# Patient Record
Sex: Female | Born: 1975 | Race: White | Hispanic: No | Marital: Single
Health system: Southern US, Community
[De-identification: ages and names within clinical notes are randomized; demographics above are authoritative.]

## PROBLEM LIST (undated history)

## (undated) DIAGNOSIS — R569 Unspecified convulsions: Secondary | ICD-10-CM

## (undated) DIAGNOSIS — G35 Multiple sclerosis: Secondary | ICD-10-CM

---

## 2004-09-20 ENCOUNTER — Inpatient Hospital Stay (HOSPITAL_COMMUNITY): Admission: AD | Admit: 2004-09-20 | Discharge: 2004-09-20 | Payer: Self-pay | Admitting: Obstetrics and Gynecology

## 2004-09-20 ENCOUNTER — Ambulatory Visit: Payer: Self-pay | Admitting: Family Medicine

## 2004-09-20 IMAGING — US US TRANSVAGINAL NON-OB
1 series · 14 of 25 positions shown · non-contrast
Comparison: none

CLINICAL DATA: Heavy vaginal bleeding for 4 days.  LMP [DATE].
PELVIC ULTRASOUND WITH TRANSVAGINAL, [DATE]
Multiple images of the uterus and adnexa were obtained using transabdominal and endovaginal approach. 
The uterus has a maximal sagittal length of 7.6 cm and an AP width of 3.9 cm.  A homogeneous uterine myometrium is seen.  The endometrial stripe appears thin with an AP width measuring 3.7 mm.  No focal thickening or inhomogeneity is noted.
Both ovaries are seen and have a normal appearance, with the right ovary measuring 3.2 x 2.6 x 2.6   cm and the left ovary measuring 3.6 x 2.5 x 2.6 cm.  Both ovaries contain several small follicles.  No cul-de-sac or periovarian fluid is seen and no separate adnexal masses are noted.
IMPRESSION
Normal pelvic ultrasound.

[Series 1: us transvaginal non-ob · 0.29mm/px · 14 of 50 slices shown]
[im 1/50]
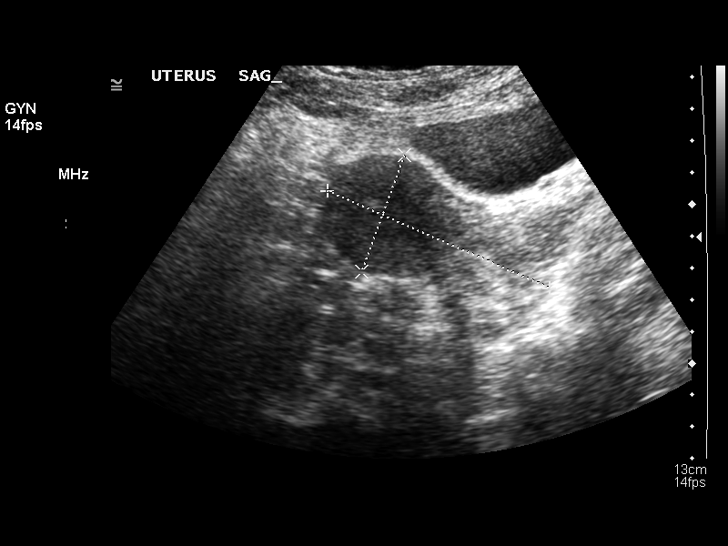
[im 5/50]
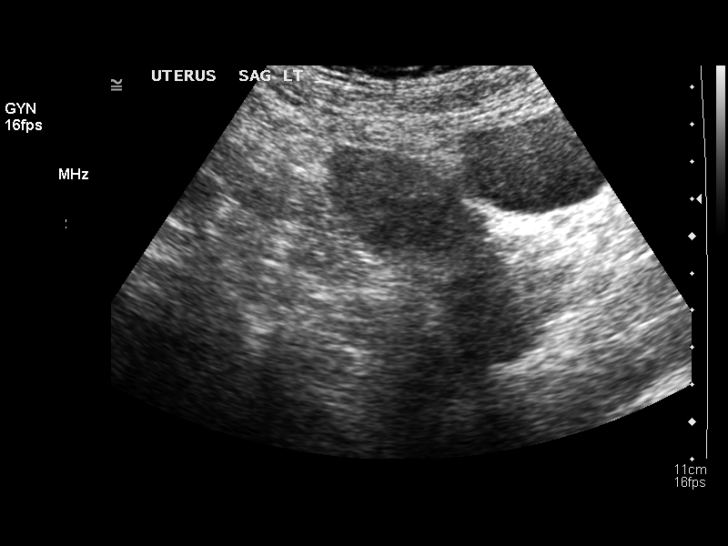
[im 9/50]
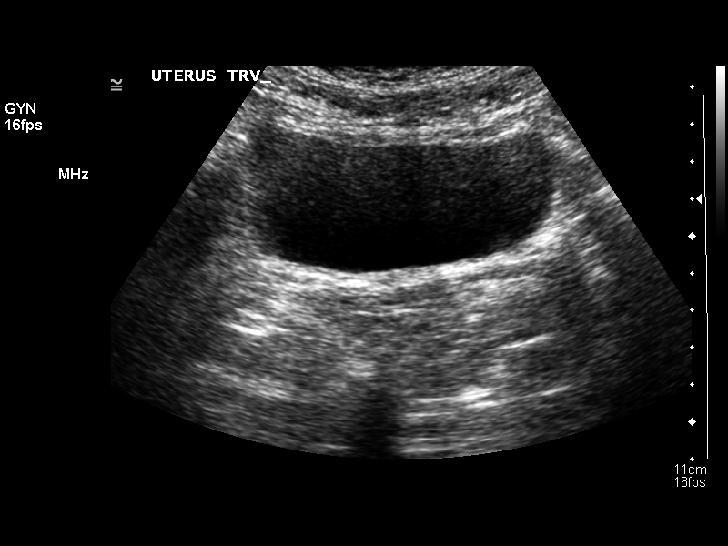
[im 13/50]
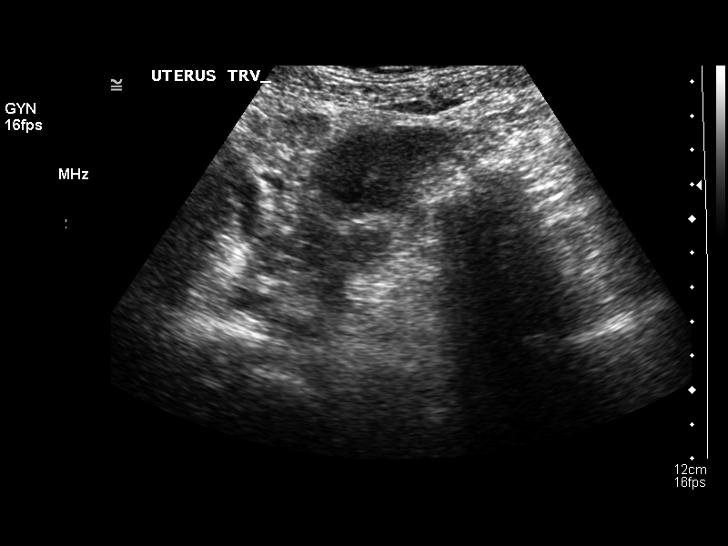
[im 17/50]
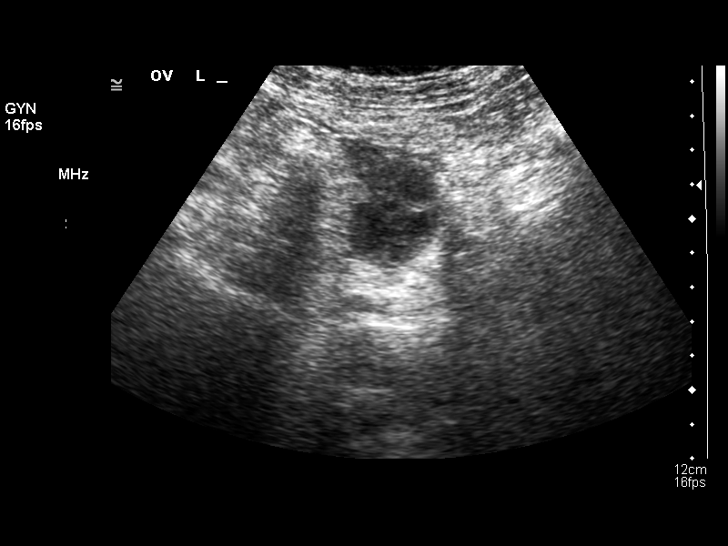
[im 19/50]
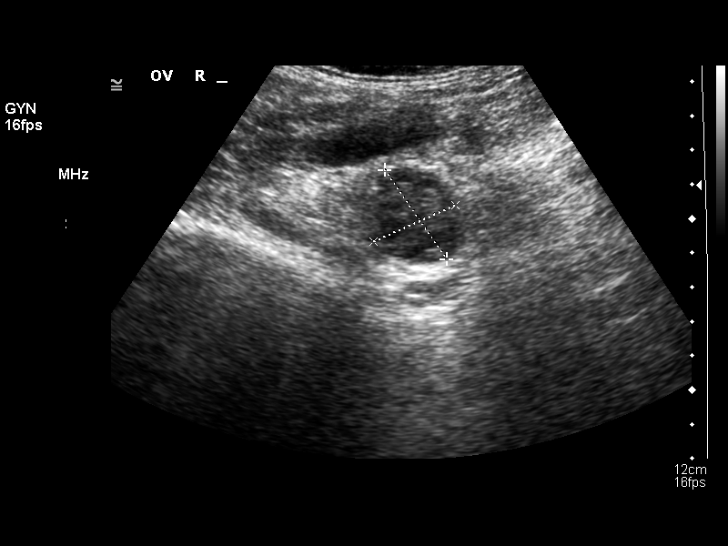
[im 23/50]
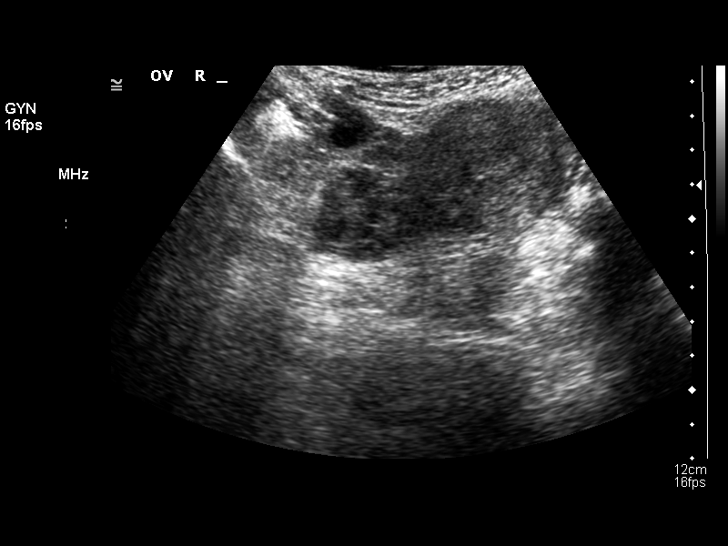
[im 27/50]
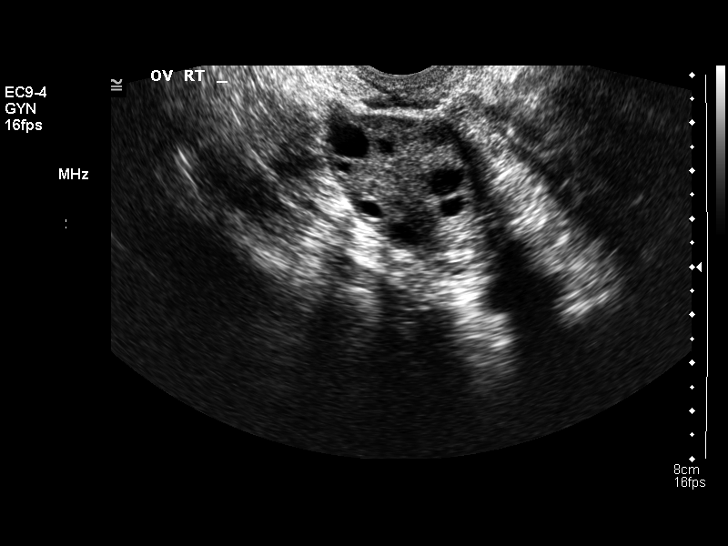
[im 31/50]
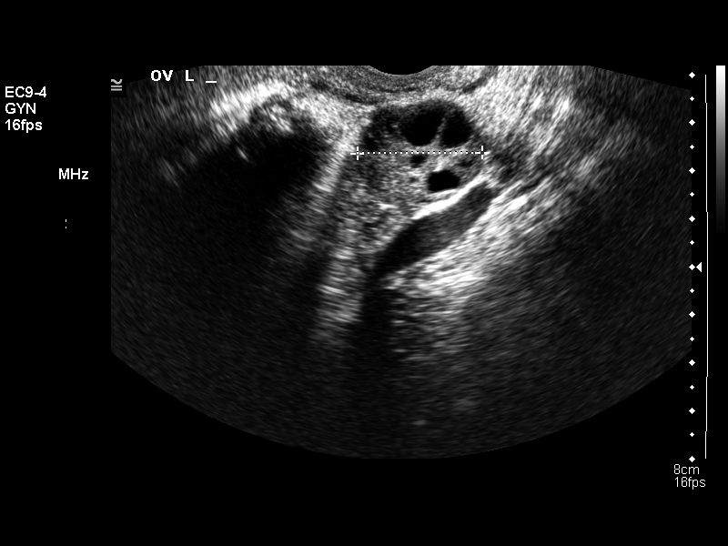
[im 33/50]
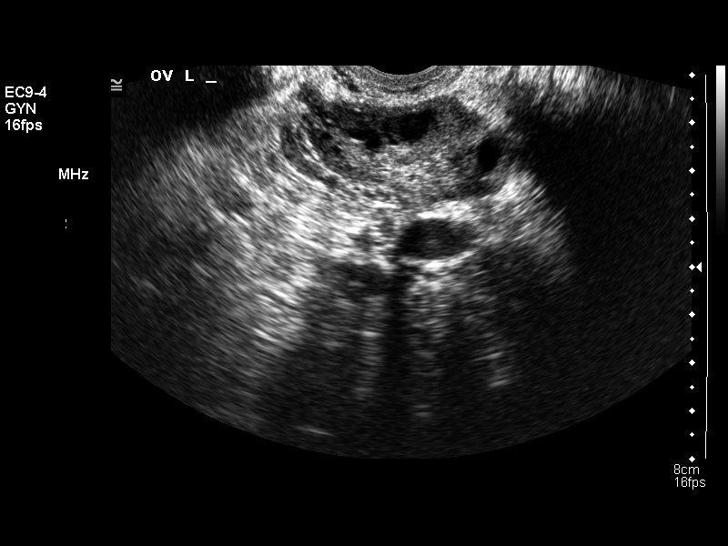
[im 37/50]
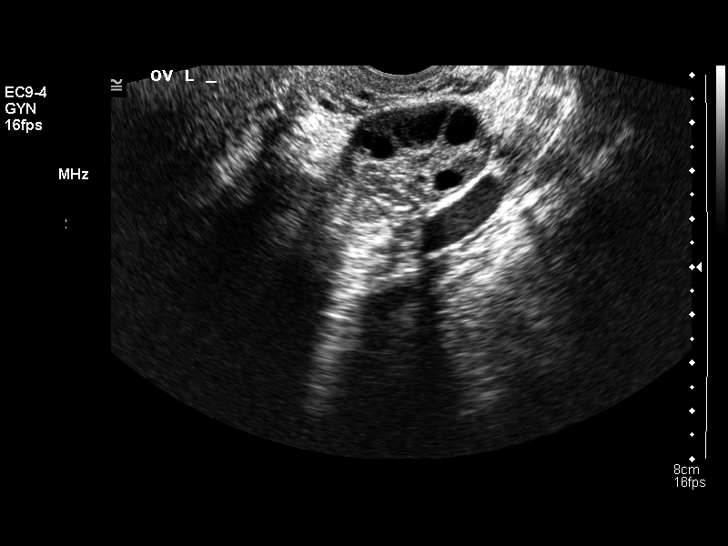
[im 41/50]
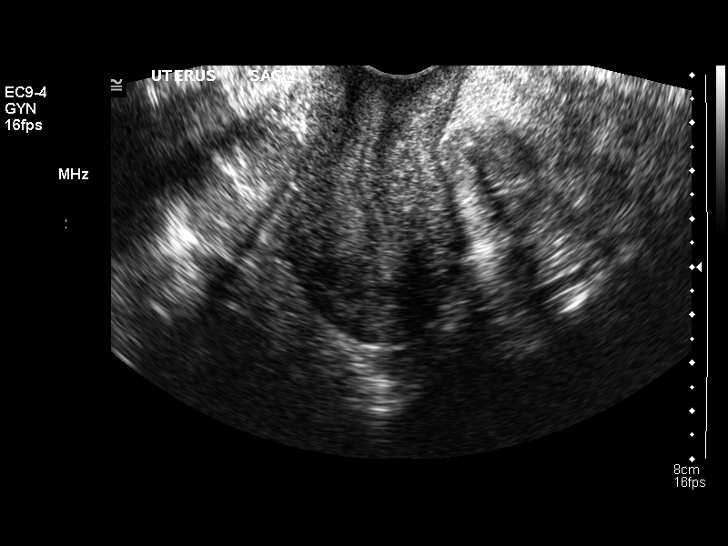
[im 45/50]
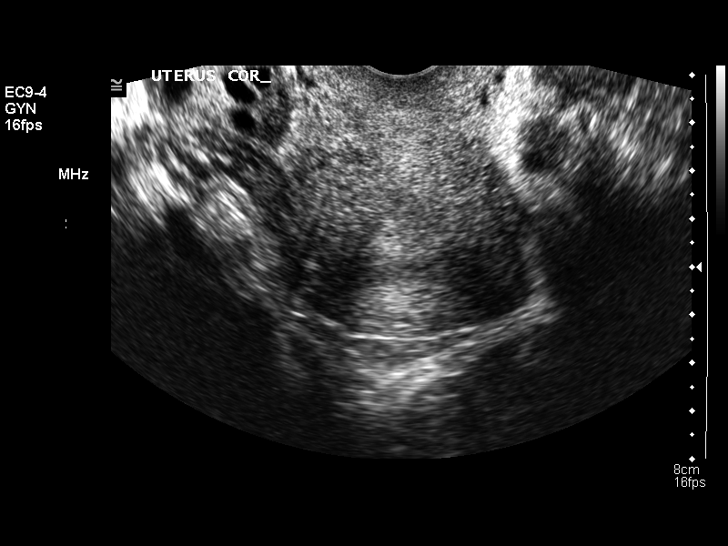
[im 50/50]
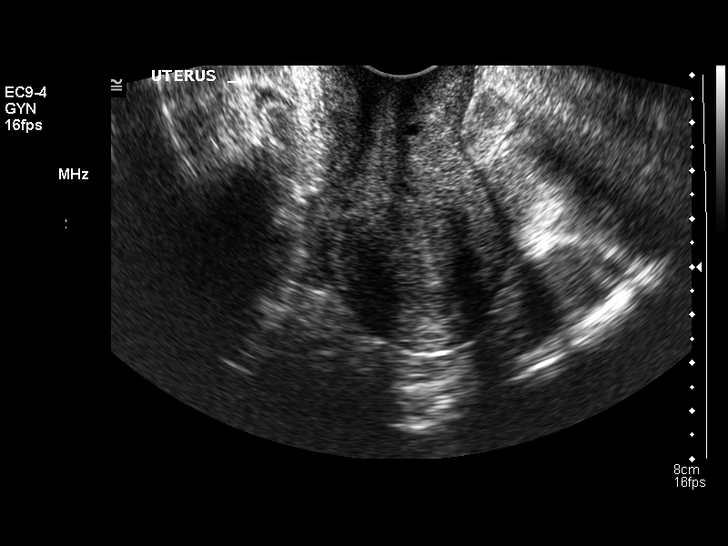

[14 of 25 positions shown; findings below may reference images not displayed]

## 2004-09-27 ENCOUNTER — Inpatient Hospital Stay (HOSPITAL_COMMUNITY): Admission: AD | Admit: 2004-09-27 | Discharge: 2004-09-27 | Payer: Self-pay | Admitting: Obstetrics and Gynecology

## 2021-04-21 ENCOUNTER — Emergency Department (HOSPITAL_COMMUNITY)
Admission: EM | Admit: 2021-04-21 | Discharge: 2021-04-22 | Disposition: A | Discharge status: Home / Self Care | Payer: 59 | Attending: Emergency Medicine | Admitting: Emergency Medicine

## 2021-04-21 ENCOUNTER — Encounter (HOSPITAL_COMMUNITY): Payer: Self-pay | Admitting: Student

## 2021-04-21 DIAGNOSIS — R5383 Other fatigue: Secondary | ICD-10-CM

## 2021-04-21 DIAGNOSIS — R531 Weakness: Secondary | ICD-10-CM | POA: Diagnosis present

## 2021-04-21 DIAGNOSIS — M7918 Myalgia, other site: Secondary | ICD-10-CM | POA: Diagnosis not present

## 2021-04-21 DIAGNOSIS — G35 Multiple sclerosis: Secondary | ICD-10-CM | POA: Insufficient documentation

## 2021-04-21 HISTORY — DX: Unspecified convulsions: R56.9

## 2021-04-21 HISTORY — DX: Multiple sclerosis: G35

## 2021-04-21 LAB — CBC WITH DIFFERENTIAL/PLATELET
Abs Immature Granulocytes: 0.02 10*3/uL (ref 0.00–0.07)
Basophils Absolute: 0 10*3/uL (ref 0.0–0.1)
Basophils Relative: 0 %
Eosinophils Absolute: 0.1 10*3/uL (ref 0.0–0.5)
Eosinophils Relative: 1 %
HCT: 41.9 % (ref 36.0–46.0)
Hemoglobin: 13.6 g/dL (ref 12.0–15.0)
Immature Granulocytes: 0 %
Lymphocytes Relative: 4 %
Lymphs Abs: 0.3 10*3/uL — ABNORMAL LOW (ref 0.7–4.0)
MCH: 30.6 pg (ref 26.0–34.0)
MCHC: 32.5 g/dL (ref 30.0–36.0)
MCV: 94.4 fL (ref 80.0–100.0)
Monocytes Absolute: 0.5 10*3/uL (ref 0.1–1.0)
Monocytes Relative: 7 %
Neutro Abs: 5.5 10*3/uL (ref 1.7–7.7)
Neutrophils Relative %: 88 %
Platelets: 296 10*3/uL (ref 150–400)
RBC: 4.44 MIL/uL (ref 3.87–5.11)
RDW: 12.9 % (ref 11.5–15.5)
WBC: 6.3 10*3/uL (ref 4.0–10.5)
nRBC: 0 % (ref 0.0–0.2)

## 2021-04-21 LAB — I-STAT BETA HCG BLOOD, ED (MC, WL, AP ONLY): I-stat hCG, quantitative: <5 m[IU]/mL (ref <5)

## 2021-04-21 NOTE — ED Triage Notes (Signed)
Pt reports that she has MS and has had multiple falls over the past few weeks, pt reports that she is having chronic dizziness, and weakness and wants everything checked out

## 2021-04-21 NOTE — ED Provider Notes (Signed)
  Emergency Medicine Provider in Triage Note   MSE was initiated and I personally evaluated the patient  10:30 PM on Apr 21, 2021 as provider in triage.   Chief Complaint: Fatigue  HPI  Patient is a 45 y.o. who presets to the ED with complaints of fatigue and just not feeling right since her last MS relapse a few weeks ago. She has trouble further describing this. States she has intermittent numbness to her whole body which is unchanged - has this at baseline.     Review of Systems  Positive: Fatigue, not feel quite right, numbness Negative: Chest pain, dyspnea, fever  Physical Exam  BP 117/80 (BP Location: Left Arm)   Pulse 88   Temp 98 F (36.7 C) (Oral)   Resp 17   SpO2 99%    Gen:   Awake, no distress   HEENT:  Atraumatic  Resp:  Normal effort  Cardiac:  Normal rate  Abd:   Nondistended, nontender  MSK:   Moves extremities without difficulty  Neuro:  Speech clear, sensation intact to light touch x 4, grip strength & plantar/dorsiflexion strength intact bilaterally.   Medical Decision Making   Initiation of care has begun. The patient has been counseled on the process, plan, and necessity for staying for the completion/evaluation, informed that the remainder of the evaluation will be completed by another provider, this initial triage assessment does not replace that evaluation, and the importance of remaining in the ED until their evaluation is complete.  Will obtain basic labs at this time.   Clinical Impression  Fatigue.        Desmond Lope 04/21/21 2237    Tegeler, Canary Brim, MD 04/22/21 [phone]

## 2021-04-22 ENCOUNTER — Other Ambulatory Visit: Payer: Self-pay

## 2021-04-22 ENCOUNTER — Emergency Department (HOSPITAL_COMMUNITY)
Admission: EM | Admit: 2021-04-22 | Discharge: 2021-04-23 | Disposition: A | Discharge status: Home / Self Care | Payer: 59 | Source: Home / Self Care | Attending: Emergency Medicine | Admitting: Emergency Medicine

## 2021-04-22 DIAGNOSIS — M791 Myalgia, unspecified site: Secondary | ICD-10-CM

## 2021-04-22 DIAGNOSIS — G35 Multiple sclerosis: Secondary | ICD-10-CM | POA: Diagnosis not present

## 2021-04-22 DIAGNOSIS — Z5321 Procedure and treatment not carried out due to patient leaving prior to being seen by health care provider: Secondary | ICD-10-CM | POA: Insufficient documentation

## 2021-04-22 DIAGNOSIS — M7918 Myalgia, other site: Secondary | ICD-10-CM | POA: Insufficient documentation

## 2021-04-22 LAB — COMPREHENSIVE METABOLIC PANEL
ALT: 23 U/L (ref 0–44)
AST: 17 U/L (ref 15–41)
Albumin: 4.3 g/dL (ref 3.5–5.0)
Alkaline Phosphatase: 55 U/L (ref 38–126)
Anion gap: 8 (ref 5–15)
BUN: 8 mg/dL (ref 6–20)
CO2: 25 mmol/L (ref 22–32)
Calcium: 9.3 mg/dL (ref 8.9–10.3)
Chloride: 103 mmol/L (ref 98–111)
Creatinine, Ser: 0.7 mg/dL (ref 0.44–1.00)
GFR, Estimated: >60 mL/min (ref >60)
Glucose, Bld: 104 mg/dL — ABNORMAL HIGH (ref 70–99)
Potassium: 3.7 mmol/L (ref 3.5–5.1)
Sodium: 136 mmol/L (ref 135–145)
Total Bilirubin: 1.2 mg/dL (ref 0.3–1.2)
Total Protein: 6.7 g/dL (ref 6.5–8.1)

## 2021-04-22 LAB — URINALYSIS, ROUTINE W REFLEX MICROSCOPIC
Bilirubin Urine: NEGATIVE
Glucose, UA: NEGATIVE mg/dL
Hgb urine dipstick: NEGATIVE
Ketones, ur: 20 mg/dL — AB
Nitrite: NEGATIVE
Protein, ur: NEGATIVE mg/dL
Specific Gravity, Urine: 1.025 (ref 1.005–1.030)
pH: 6 (ref 5.0–8.0)

## 2021-04-22 MED ORDER — GABAPENTIN 100 MG PO CAPS
100.0000 mg | ORAL_CAPSULE | Freq: Once | ORAL | Status: AC
  Administered 2021-04-22: 100 mg via ORAL
  Filled 2021-04-22: qty 1

## 2021-04-22 NOTE — ED Notes (Signed)
The pt immediately went back to sleep

## 2021-04-22 NOTE — ED Provider Notes (Signed)
[facility] DEPT [facility] Emergency Department Provider Note MRN:  [id number]  Arrival date & time: 04/22/21     Chief Complaint   Multiple Sclerosis   History of Present Illness   Sonia Ayala is a 45 y.o. year-old female with a history of MS presenting to the ED with chief complaint of MS.  Patient explains that she was recently in the ICU for her MS.  She had a recent flare and she is upset because she has not recovered very well.  She denies any new or worsening symptoms over the past few days or weeks.  She is having trouble coping with her symptoms.  She endorses total body weakness, migratory sensory abnormalities.  Denies any change to her speech, no vision change, no chest pain or shortness of breath, no abdominal pain.  Symptoms constant, no exacerbating or alleviating factors.  Review of Systems  A complete 10 system review of systems was obtained and all systems are negative except as noted in the HPI and PMH.   Patient's Health History    Past Medical History:  Diagnosis Date  . Multiple sclerosis (HCC)   . Seizures (HCC)     History reviewed. No pertinent surgical history.  History reviewed. No pertinent family history.  Social History   Socioeconomic History  . Marital status: Single    Spouse name: Not on file  . Number of children: Not on file  . Years of education: Not on file  . Highest education level: Not on file  Occupational History  . Not on file  Tobacco Use  . Smoking status: Not on file  . Smokeless tobacco: Not on file  Substance and Sexual Activity  . Alcohol use: Not on file  . Drug use: Not on file  . Sexual activity: Not on file  Other Topics Concern  . Not on file  Social History Narrative  . Not on file   Social Determinants of Health   Financial Resource Strain: Not on file  Food Insecurity: Not on file  Transportation Needs: Not on file  Physical Activity: Not on file  Stress: Not on file  Social Connections:  Not on file  Intimate Partner Violence: Not on file     Physical Exam   Vitals:   04/21/21 2214  BP: 117/80  Pulse: 88  Resp: 17  Temp: 98 F (36.7 C)  SpO2: 99%    CONSTITUTIONAL: Well-appearing, NAD NEURO:  Alert and oriented x 3, no focal deficits, slow gait EYES:  eyes equal and reactive ENT/NECK:  no LAD, no JVD CARDIO: Regular rate, well-perfused, normal S1 and S2 PULM:  CTAB no wheezing or rhonchi GI/GU:  normal bowel sounds, non-distended, non-tender MSK/SPINE:  No gross deformities, no edema SKIN:  no rash, atraumatic PSYCH:  Appropriate speech and behavior  *Additional and/or pertinent findings included in MDM below  Diagnostic and Interventional Summary    EKG Interpretation  Date/Time:    Ventricular Rate:    PR Interval:    QRS Duration:   QT Interval:    QTC Calculation:   R Axis:     Text Interpretation:        Labs Reviewed  COMPREHENSIVE METABOLIC PANEL - Abnormal; Notable for the following components:      Result Value   Glucose, Bld 104 (*)    All other components within normal limits  CBC WITH DIFFERENTIAL/PLATELET - Abnormal; Notable for the following components:   Lymphs Abs 0.3 (*)    All  other components within normal limits  URINALYSIS, ROUTINE W REFLEX MICROSCOPIC - Abnormal; Notable for the following components:   Color, Urine AMBER (*)    APPearance CLOUDY (*)    Ketones, ur 20 (*)    Leukocytes,Ua SMALL (*)    Bacteria, UA FEW (*)    All other components within normal limits  I-STAT BETA HCG BLOOD, ED ([facility], [facility], [facility] ONLY)    No orders to display    Medications  gabapentin (NEURONTIN) capsule 100 mg (100 mg Oral Given 04/22/21 0401)     Procedures  /  Critical Care Procedures  ED Course and Medical Decision Making  I have reviewed the triage vital signs, the nursing notes, and pertinent available records from the EMR.  Listed above are laboratory and imaging tests that I personally ordered, reviewed, and interpreted and  then considered in my medical decision making (see below for details).  This is a difficult patient encounter given the lack of EMR information.  She endorses a history of MS, but we have no evidence of that in our records.  She explains that she is recently moved from [place].  When asked about where she spent time in the ICU for her MS, she cannot remember.  She has trouble even remembering what state the hospital was in.  I have sat down and spoken with her multiple times about her symptoms, even she explains that she does not think she is currently having a flare, she is just having trouble coping with her symptoms.  Lack of support emotionally.  Denies SI or HI or AVH.  On my third encounter with the patient, she begins to ask for pain medicine.  Initially she did not mention any pain.  And now I am starting to suspect some type of secondary gain.  She has a bit of a slurred slowed speech which she says is normal for her, and so initially attributed to MS but now I am considering that she may be acutely intoxicated with alcohol.  Adding on ethanol level while patient tries to remember what hospital she was in the ICU.  Overall no emergent process is evident today and the plan is likely discharge.       Elmer Sow. Pilar Plate, MD [facility] Emergency Medicine [facility] [email or web address]  Final Clinical Impressions(s) / ED Diagnoses     ICD-10-CM   1. Fatigue, unspecified type  R53.83     ED Discharge Orders    None       Discharge Instructions Discussed with and Provided to Patient:     Discharge Instructions     You were evaluated in the Emergency Department and after careful evaluation, we did not find any emergent condition requiring admission or further testing in the hospital.  Your exam/testing today was overall reassuring.  Recommend follow-up with the neurology service for further management of your symptoms.  Please return to the Emergency Department if you  experience any worsening of your condition.  Thank you for allowing [place] to be a part of your care.        Sabas Sous, MD 04/22/21 [phone]

## 2021-04-22 NOTE — ED Notes (Signed)
Pt asleep in the hallway unable to wake her

## 2021-04-22 NOTE — ED Notes (Signed)
No adverse effects from the neuronton  She professes to be allergic

## 2021-04-22 NOTE — ED Notes (Signed)
The pt  Had to be  Vigorously moved to wake her up.  After she took the neuronton  She  Stated that she was allergic to neuronton.. I asked if this was in her chart  She replied no I just forgot and she went back to sleep dr Pilar Plate notified

## 2021-04-22 NOTE — ED Notes (Signed)
The pt reports tht she had a grey bag with her that she can no longer find it is not in sight and it is not in our waiting room  She also wants some food to eat

## 2021-04-22 NOTE — ED Triage Notes (Addendum)
Pt states she has MS, has had muscle aches x 10 years. Pt states she is having muscle aches. Pt seen for same last night.

## 2021-04-22 NOTE — ED Provider Notes (Addendum)
  Emergency Medicine Provider in Triage Note   MSE was initiated and I personally evaluated the patient  11:37 PM on Apr 22, 2021 as provider in triage.   Chief Complaint: myalgias  HPI  Patient is a 45 y.o. who presents to the ED with complaints of difficulty coping with her MS, having some mylagias, generally does not feel well. States this does not feel like prior flares, does have chronic migratory sensory abnormalities.   On further discussion patient is new to the area, she does not have anywhere to stay currently and does not know anyone locally.   Review of Systems  Positive: Myalgias, fatigue Negative: Vision disturbance, acute numbness/weakness.   Physical Exam  BP (!) 130/106 (BP Location: Left Arm)   Pulse 84   Temp 98.4 F (36.9 C) (Oral)   Resp 16   SpO2 100%    Gen:   Awake, no distress   HEENT:  Atraumatic  Resp:  Normal effort  Cardiac:  Normal rate  Abd:   Nondistended, nontender  MSK:   Moves extremities without difficulty  Neuro:  Sensation intact to light touch x 4.   Medical Decision Making   Initiation of care has begun. The patient has been counseled on the process, plan, and necessity for staying for the completion/evaluation, informed that the remainder of the evaluation will be completed by another provider, this initial triage assessment does not replace that evaluation, and the importance of remaining in the ED until their evaluation is complete.   Clinical Impression  myalgias        Cherly Anderson, PA-C 04/22/21 2339    Cherly Anderson, PA-C 04/22/21 2339    Nira Conn, MD 04/26/21 2106

## 2021-04-22 NOTE — ED Notes (Signed)
Patient brought to the bathroom for urine sample

## 2021-04-22 NOTE — Discharge Instructions (Addendum)
You were evaluated in the Emergency Department and after careful evaluation, we did not find any emergent condition requiring admission or further testing in the hospital.  Your exam/testing today was overall reassuring.  Recommend follow-up with the neurology service for further management of your symptoms.  Please return to the Emergency Department if you experience any worsening of your condition.  Thank you for allowing [place] to be a part of your care.

## 2021-04-23 LAB — BASIC METABOLIC PANEL
Anion gap: 8 (ref 5–15)
BUN: 6 mg/dL (ref 6–20)
CO2: 22 mmol/L (ref 22–32)
Calcium: 8.8 mg/dL — ABNORMAL LOW (ref 8.9–10.3)
Chloride: 103 mmol/L (ref 98–111)
Creatinine, Ser: 0.63 mg/dL (ref 0.44–1.00)
GFR, Estimated: >60 mL/min (ref >60)
Glucose, Bld: 114 mg/dL — ABNORMAL HIGH (ref 70–99)
Potassium: 3.4 mmol/L — ABNORMAL LOW (ref 3.5–5.1)
Sodium: 133 mmol/L — ABNORMAL LOW (ref 135–145)

## 2021-04-23 LAB — CBC
HCT: 41 % (ref 36.0–46.0)
Hemoglobin: 13.8 g/dL (ref 12.0–15.0)
MCH: 30.9 pg (ref 26.0–34.0)
MCHC: 33.7 g/dL (ref 30.0–36.0)
MCV: 91.9 fL (ref 80.0–100.0)
Platelets: 244 10*3/uL (ref 150–400)
RBC: 4.46 MIL/uL (ref 3.87–5.11)
RDW: 13 % (ref 11.5–15.5)
WBC: 3.4 10*3/uL — ABNORMAL LOW (ref 4.0–10.5)
nRBC: 0 % (ref 0.0–0.2)

## 2021-04-23 LAB — HCG, SERUM, QUALITATIVE: Preg, Serum: NEGATIVE

## 2021-04-23 LAB — ETHANOL: Alcohol, Ethyl (B): <10 mg/dL (ref <10)

## 2021-04-24 ENCOUNTER — Other Ambulatory Visit: Payer: Self-pay

## 2021-04-24 ENCOUNTER — Emergency Department (HOSPITAL_COMMUNITY): Payer: 59

## 2021-04-24 ENCOUNTER — Emergency Department (HOSPITAL_COMMUNITY)
Admission: EM | Admit: 2021-04-24 | Discharge: 2021-04-24 | Disposition: A | Discharge status: Home / Self Care | Payer: 59 | Attending: Emergency Medicine | Admitting: Emergency Medicine

## 2021-04-24 DIAGNOSIS — R200 Anesthesia of skin: Secondary | ICD-10-CM | POA: Diagnosis not present

## 2021-04-24 DIAGNOSIS — G35 Multiple sclerosis: Secondary | ICD-10-CM

## 2021-04-24 DIAGNOSIS — R5383 Other fatigue: Secondary | ICD-10-CM | POA: Diagnosis present

## 2021-04-24 LAB — I-STAT BETA HCG BLOOD, ED (MC, WL, AP ONLY): I-stat hCG, quantitative: <5 m[IU]/mL (ref <5)

## 2021-04-24 LAB — BASIC METABOLIC PANEL
Anion gap: 11 (ref 5–15)
BUN: 6 mg/dL (ref 6–20)
CO2: 24 mmol/L (ref 22–32)
Calcium: 9 mg/dL (ref 8.9–10.3)
Chloride: 101 mmol/L (ref 98–111)
Creatinine, Ser: 0.69 mg/dL (ref 0.44–1.00)
GFR, Estimated: >60 mL/min (ref >60)
Glucose, Bld: 96 mg/dL (ref 70–99)
Potassium: 3.7 mmol/L (ref 3.5–5.1)
Sodium: 136 mmol/L (ref 135–145)

## 2021-04-24 LAB — CBC WITH DIFFERENTIAL/PLATELET
Abs Immature Granulocytes: 0.01 10*3/uL (ref 0.00–0.07)
Basophils Absolute: 0 10*3/uL (ref 0.0–0.1)
Basophils Relative: 0 %
Eosinophils Absolute: 0 10*3/uL (ref 0.0–0.5)
Eosinophils Relative: 0 %
HCT: 45.6 % (ref 36.0–46.0)
Hemoglobin: 15.1 g/dL — ABNORMAL HIGH (ref 12.0–15.0)
Immature Granulocytes: 0 %
Lymphocytes Relative: 11 %
Lymphs Abs: 0.3 10*3/uL — ABNORMAL LOW (ref 0.7–4.0)
MCH: 30.3 pg (ref 26.0–34.0)
MCHC: 33.1 g/dL (ref 30.0–36.0)
MCV: 91.6 fL (ref 80.0–100.0)
Monocytes Absolute: 0.5 10*3/uL (ref 0.1–1.0)
Monocytes Relative: 18 %
Neutro Abs: 1.9 10*3/uL (ref 1.7–7.7)
Neutrophils Relative %: 71 %
Platelets: 255 10*3/uL (ref 150–400)
RBC: 4.98 MIL/uL (ref 3.87–5.11)
RDW: 13 % (ref 11.5–15.5)
WBC: 2.6 10*3/uL — ABNORMAL LOW (ref 4.0–10.5)
nRBC: 0 % (ref 0.0–0.2)

## 2021-04-24 IMAGING — MR MR CERVICAL SPINE WO/W CM
4 of 8 series · 17 of 48 positions shown · IV contrast (gadavist)
Comparison: No pertinent prior exams available for comparison.

CLINICAL DATA: Demyelinating disease. Additional provided: Multiple
sclerosis.

EXAM:
MRI CERVICAL SPINE WITHOUT AND WITH CONTRAST
TECHNIQUE: Multiplanar and multiecho pulse sequences of the cervical spine, to
include the craniocervical junction and cervicothoracic junction,
were obtained without and with intravenous contrast.
CONTRAST:  9.5mL GADAVIST GADOBUTROL 1 MMOL/ML IV SOLN

[Series 10: T2 · sagittal · 3.0mm · 0.43mm/px · 3 of 18 slices shown (1 of 2)]
[im 1/18]
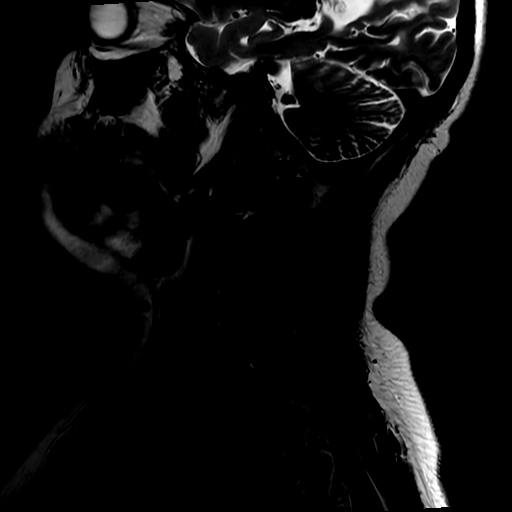
[im 9/18]
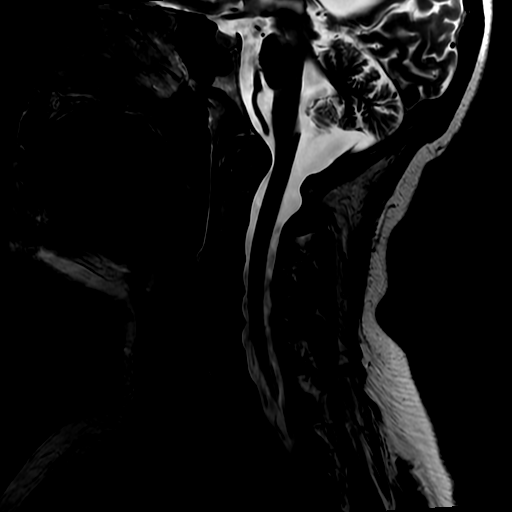
[im 18/18]
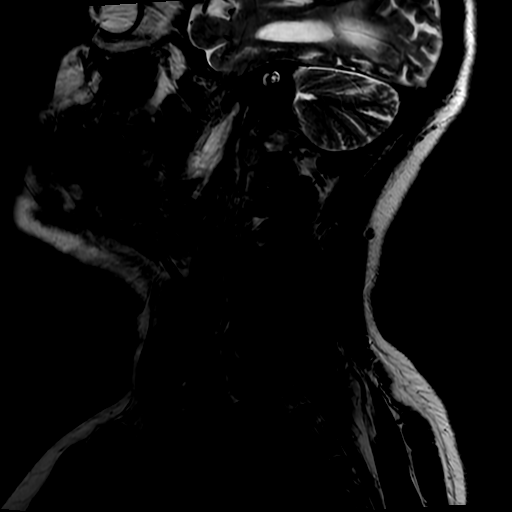

[Series 15: T2 · axial · 3.0mm · 0.35mm/px · z∈[-189,-76]mm · 6 of 35 slices shown (2 of 2)]
[im 1/35]
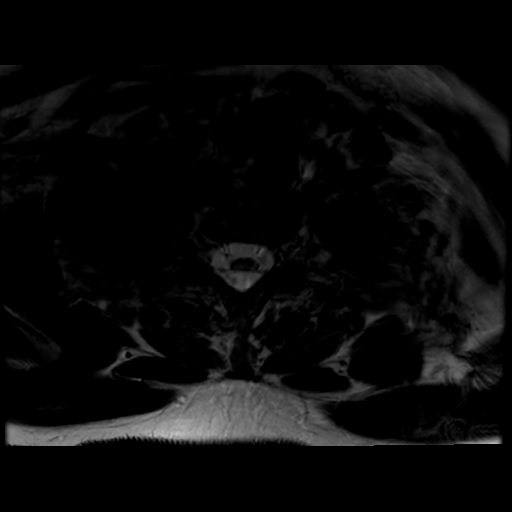
[im 7/35]
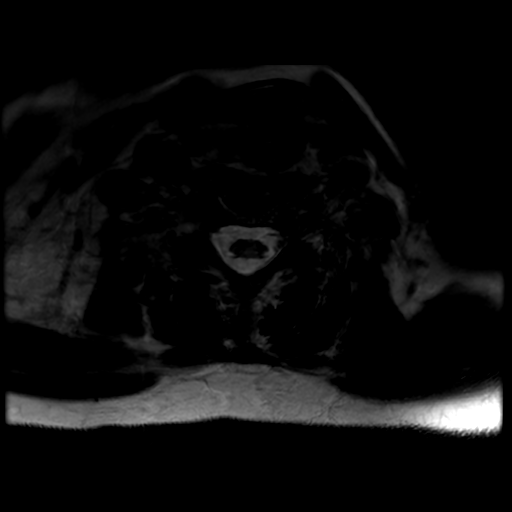
[im 14/35]
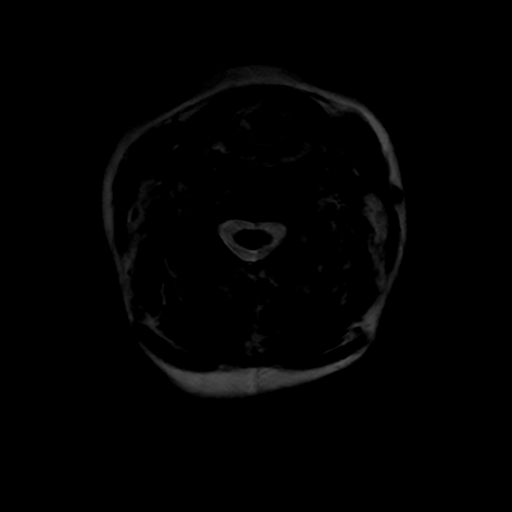
[im 21/35]
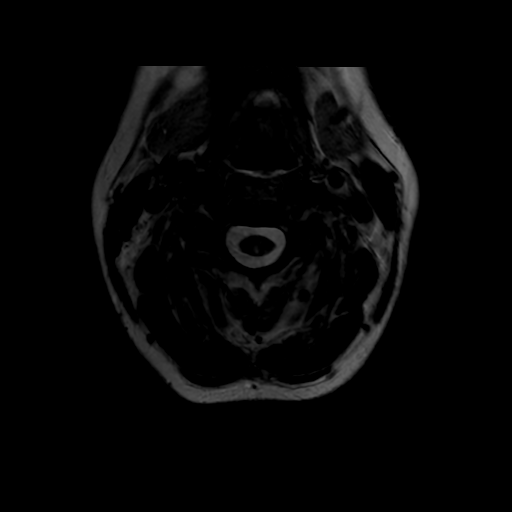
[im 28/35]
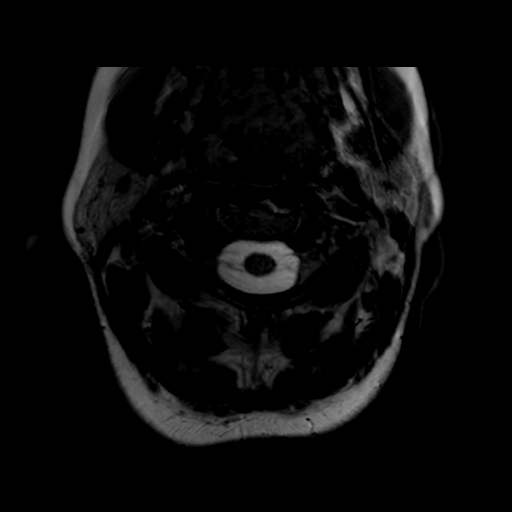
[im 35/35]
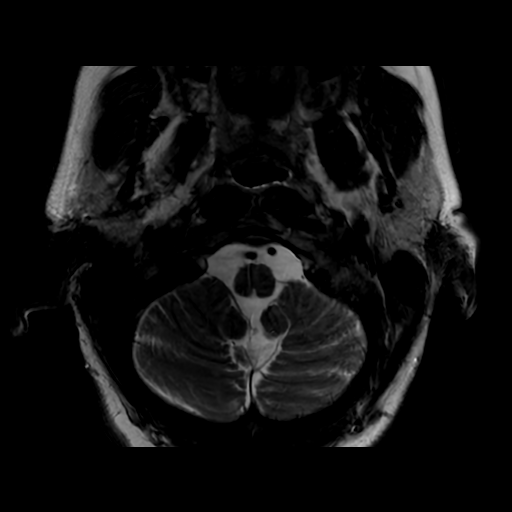

[Series 17: T1 · axial · non-contrast · 3.0mm · 0.35mm/px · z∈[-189,-76]mm · 5 of 36 slices shown]
[im 1/36]
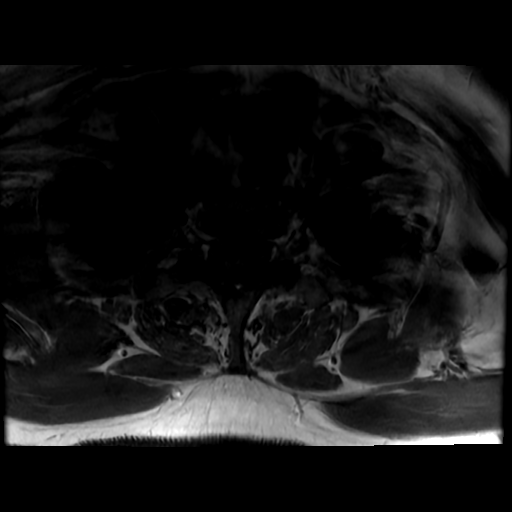
[im 8/36]
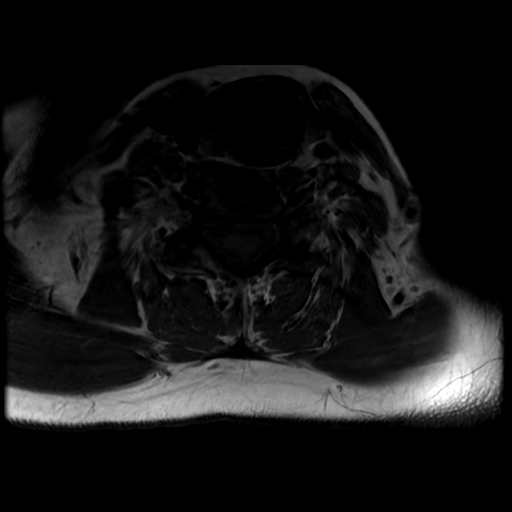
[im 15/36]
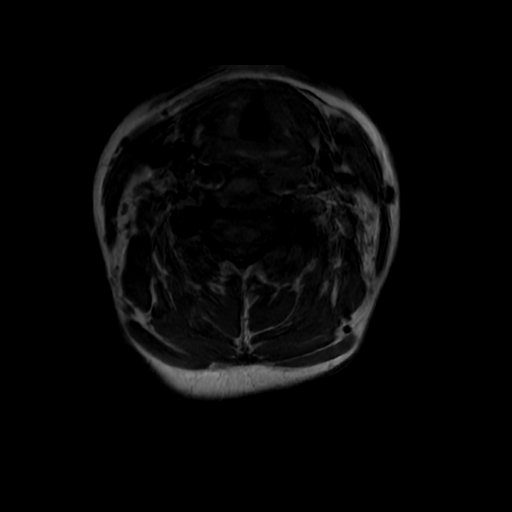
[im 22/36]
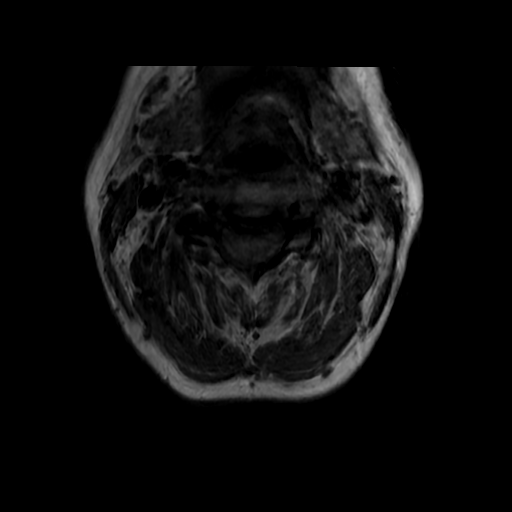
[im 36/36]
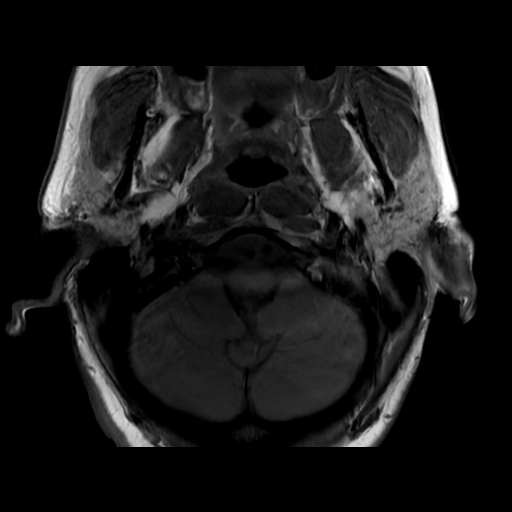

[Series 18: T1 fat-sat post-contrast · sagittal · 3.0mm · 0.43mm/px · 3 of 18 slices shown]
[im 1/18]
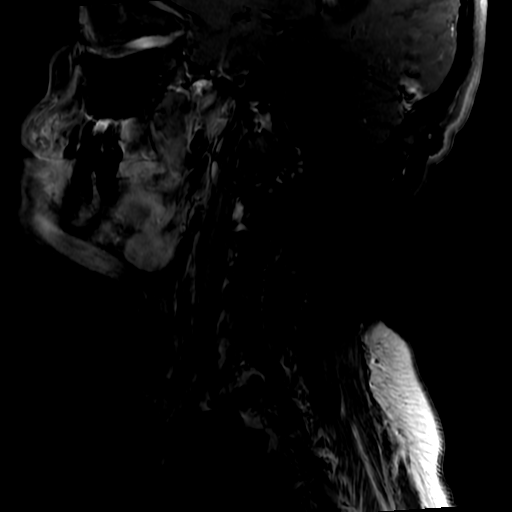
[im 9/18]
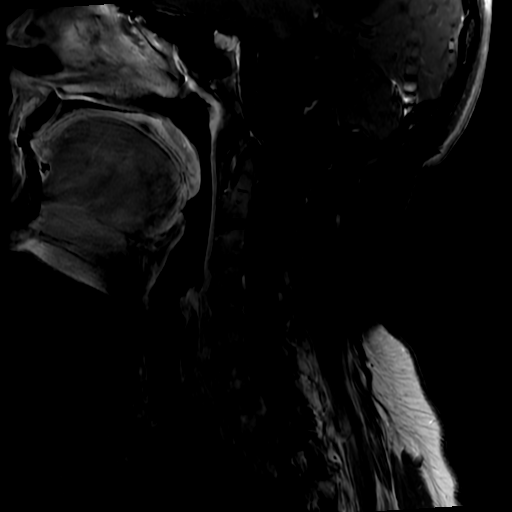
[im 18/18]
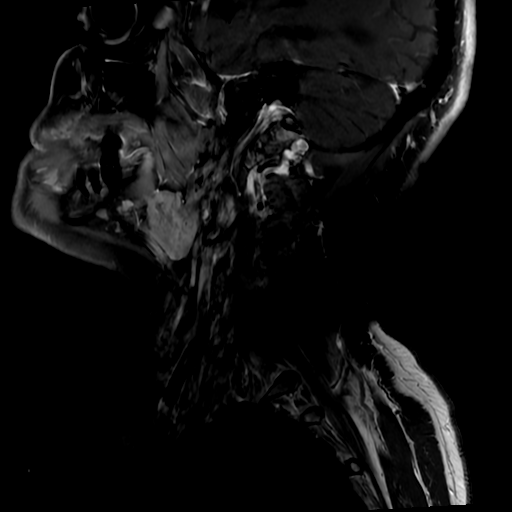

[17 of 48 positions shown; findings below may reference images not displayed]

FINDINGS: Motion degraded examination. Most notably, there is moderate/severe
motion degradation of the sagittal T1 weighted sequence,
moderate/severe motion degradation of the sagittal STIR sequence,
moderate motion degradation of the axial T2 TSE sequence, moderate
motion degradation of the axial T2 GRE sequence, moderate motion
degradation of the sagittal T1 weighted postcontrast sequence and
mild-to-moderate motion degradation of the axial T1 weighted
postcontrast sequence. This significantly limits evaluation.

Alignment: Straightening of the expected cervical lordosis. Trace
C5-C6 grade 1 retrolisthesis.

Vertebrae: Vertebral body height is maintained. Mild multifocal
degenerative edema within the posterior elements, most notably
within the right posterior elements at C4 and C5.

Cord: Motion degradation significantly limits evaluation for
cervical spinal cord signal abnormality. However, there are apparent
scattered T2 hyperintense lesions throughout the cervical and
visualized upper thoracic spinal cord, consistent with the provided
history of multiple sclerosis. Within the limitations of motion
degradation, no spinal cord enhancement is identified to suggest
active demyelination.

Posterior Fossa, vertebral arteries, paraspinal tissues: Posterior
fossa better assessed on concurrently performed brain MRI. Flow
voids preserved within the imaged cervical vertebral arteries.
Paraspinal soft tissues within normal limits.

Disc levels:

Mild multilevel disc degeneration, greatest at C5-C6.

C2-C3: No significant disc herniation or stenosis.

C3-C4: Facet arthrosis.  No significant disc herniation or stenosis.

C4-C5: Shallow disc bulge. Uncovertebral hypertrophy and facet
arthrosis. Minimal partial effacement of the ventral thecal sac
without significant central canal stenosis. Mild bilateral neural
foraminal narrowing

C5-C6: Trace retrolisthesis. Shallow disc bulge. Superimposed right
center/foraminal disc protrusion with associated osteophyte ridge.
Uncovertebral and facet hypertrophy. Minimal partial effacement of
the ventral thecal sac without significant central canal stenosis.
Bilateral neural foraminal narrowing (severe right, mild left).

C6-C7: Facet arthrosis.  No significant disc herniation or stenosis.

C7-T1: No significant disc herniation or stenosis.
IMPRESSION: Significantly motion degraded examination, as described and
significantly limiting evaluation.

Apparent multifocal T2 hyperintense signal abnormality throughout
cervical and visualized upper thoracic spinal cord. Findings are
compatible with the provided history of multiple sclerosis. Within
the limitations of motion degradation, no definite abnormal spinal
cord enhancement is demonstrated to suggest active demyelination.

Cervical spondylosis, as described. No more than mild relative
spinal canal narrowing. Multilevel neural foraminal narrowing,
greatest on the right at C5-C6 (severe at this site).

## 2021-04-24 IMAGING — MR MR HEAD WO/W CM
7 of 13 series · 26 of 48 positions shown · IV contrast (9.5 M GAD)
Comparison: No pertinent prior exams available for comparison.

CLINICAL DATA: Demyelinating disease; history of MS.

EXAM:
MRI HEAD WITHOUT AND WITH CONTRAST
TECHNIQUE: Multiplanar, multiecho pulse sequences of the brain and surrounding
structures were obtained without and with intravenous contrast.
CONTRAST:  9.5mL GADAVIST GADOBUTROL 1 MMOL/ML IV SOLN

[Series 2: DWI · axial · 3.0mm · 0.94mm/px · z∈[-57,+89]mm · 7 of 100 slices shown (1 of 2)]
[im 1/100]
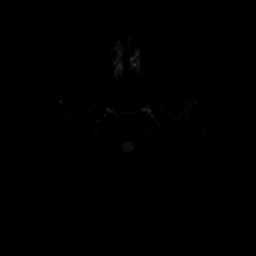
[im 17/100]
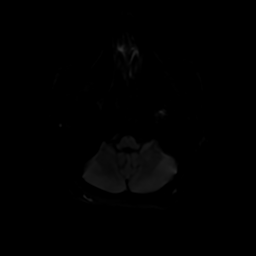
[im 34/100]
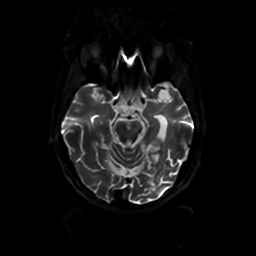
[im 50/100]
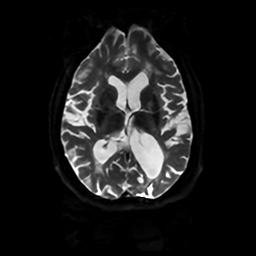
[im 67/100]
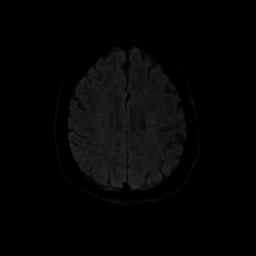
[im 83/100]
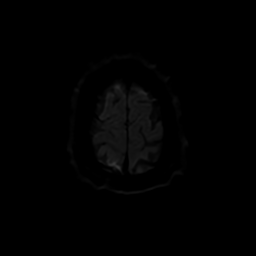
[im 100/100]
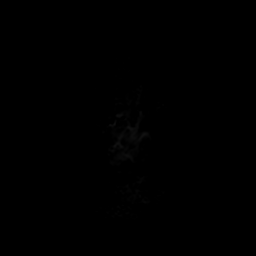

[Series 3: DWI · coronal · 4.0mm · 0.94mm/px · 6 of 72 slices shown (2 of 2)]
[im 1/72]
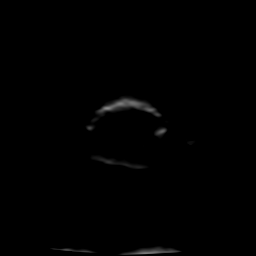
[im 15/72]
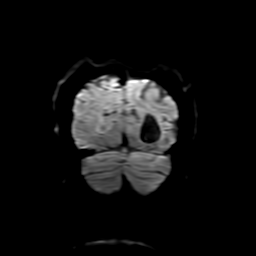
[im 29/72]
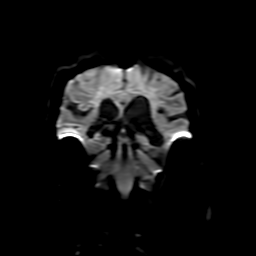
[im 43/72]
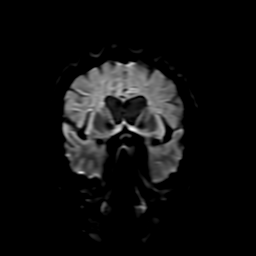
[im 57/72]
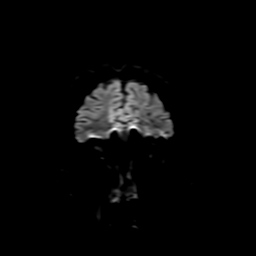
[im 72/72]
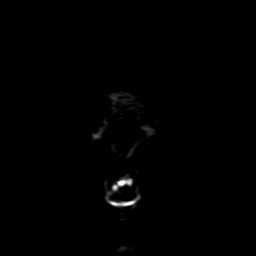

[Series 4: FLAIR · sagittal · 5.0mm · 0.23mm/px · 2 of 25 slices shown (1 of 2)]
[im 1/25]
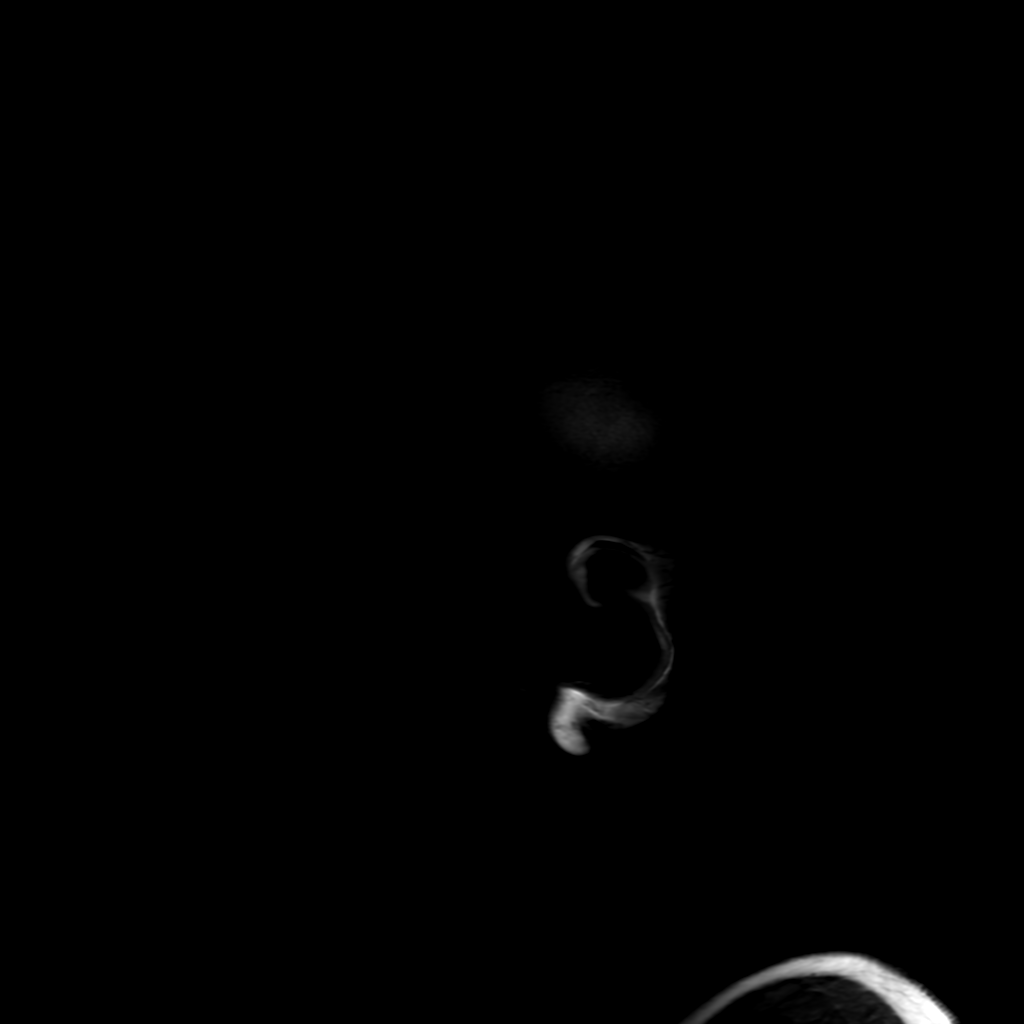
[im 25/25]
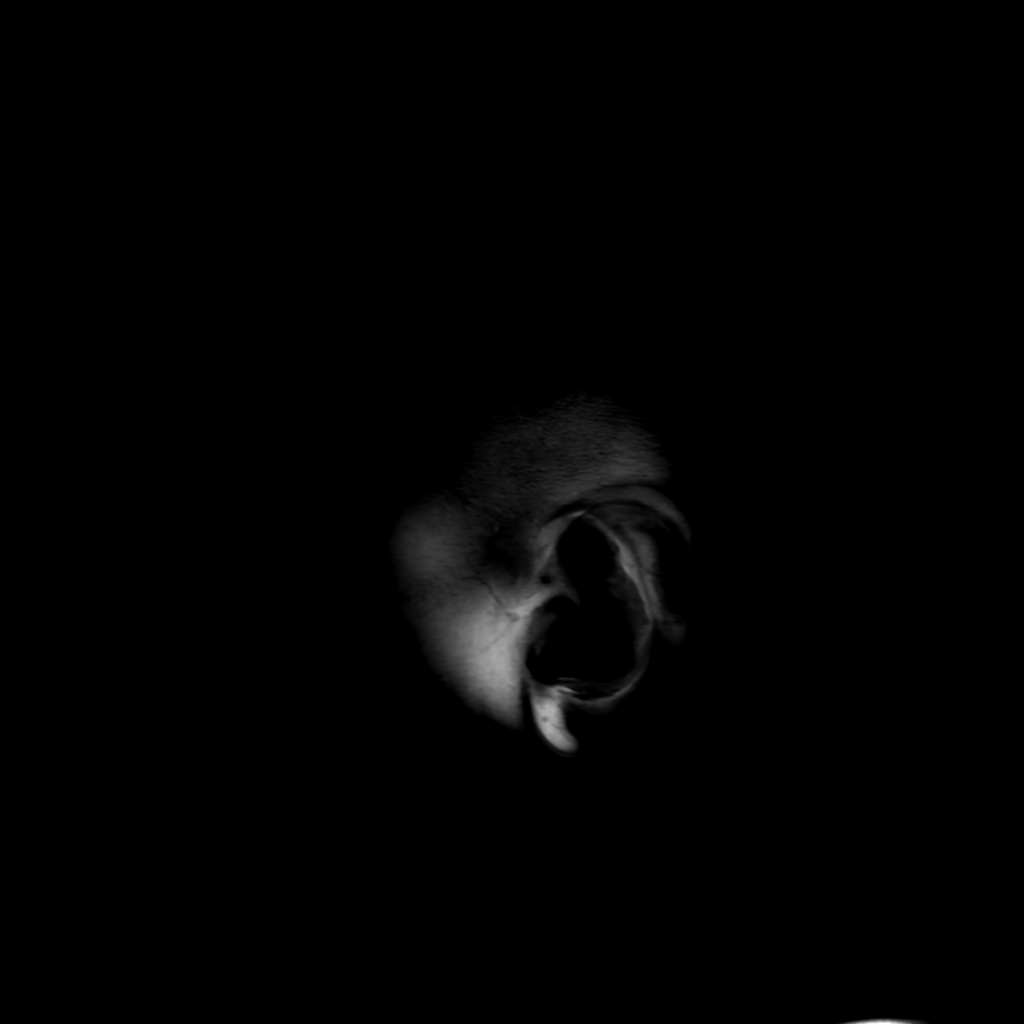

[Series 6: FLAIR · axial · 3.0mm · 0.45mm/px · z∈[-52,+97]mm · 2 of 26 slices shown (2 of 2)]
[im 1/26]
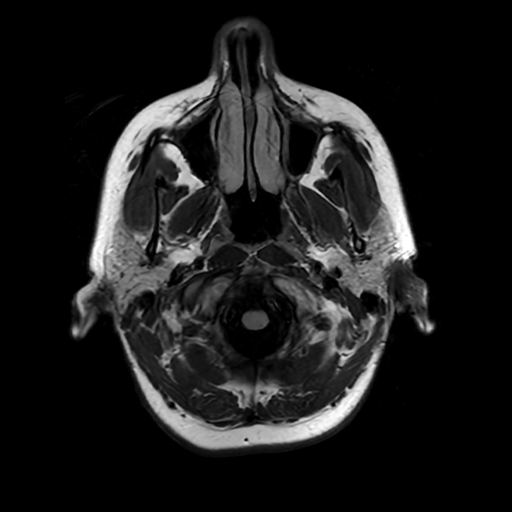
[im 26/26]
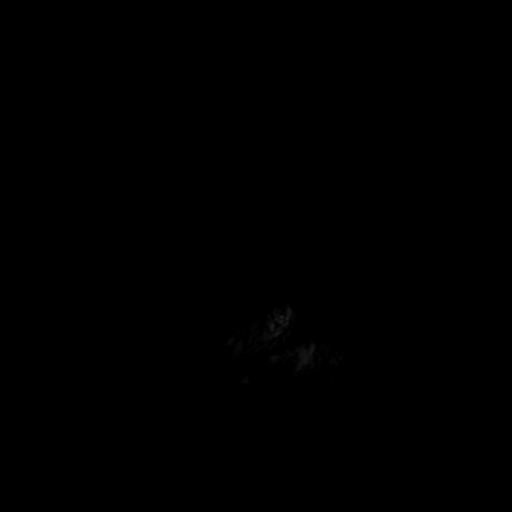

[Series 23: FLAIR post-contrast · sagittal · 5.0mm · 0.47mm/px · 2 of 25 slices shown]
[im 1/25]
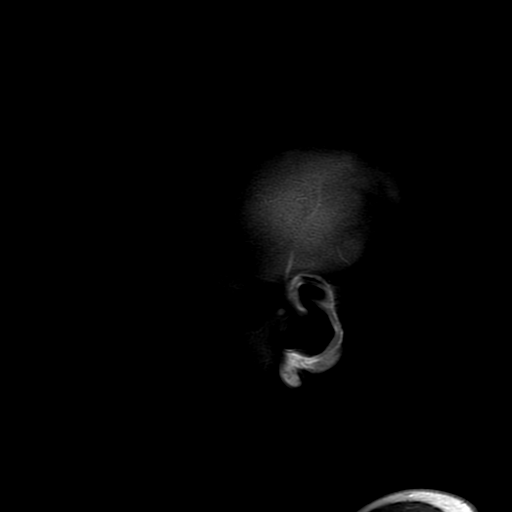
[im 25/25]
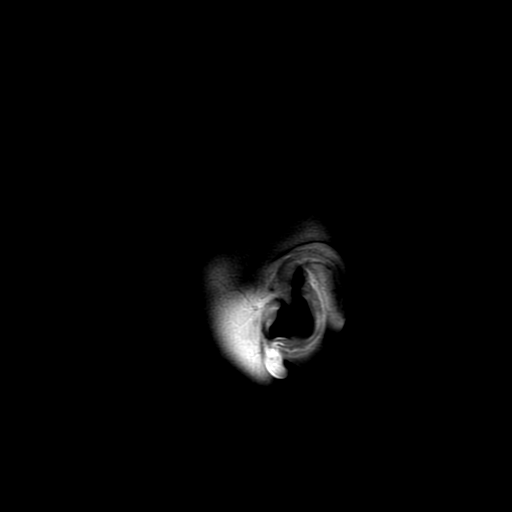

[Series 250: ADC · axial · 3.0mm · 0.94mm/px · z∈[-57,+89]mm · 4 of 50 slices shown (1 of 2)]
[im 1/50]
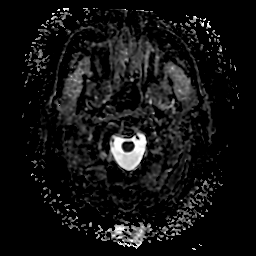
[im 17/50]
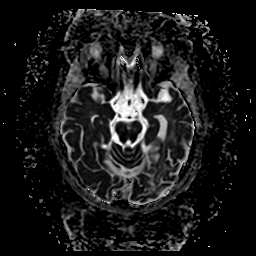
[im 33/50]
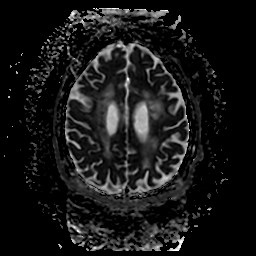
[im 50/50]
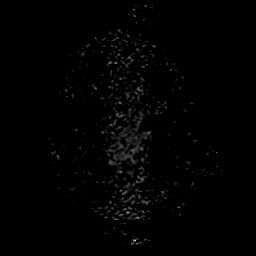

[Series 350: ADC · coronal · 4.0mm · 0.94mm/px · 3 of 36 slices shown (2 of 2)]
[im 1/36]
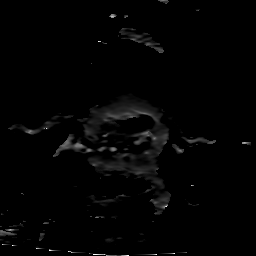
[im 18/36]
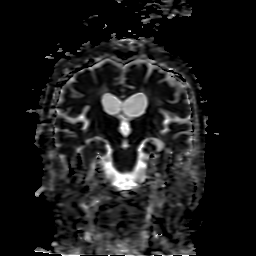
[im 36/36]
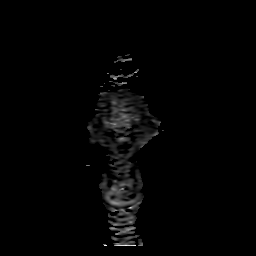

[26 of 48 positions shown; findings below may reference images not displayed]

FINDINGS: Brain:

Intermittently motion degraded examination. Most notably, there is
moderate motion degradation of the axial T2 weighted sequence.

Age advanced cerebral atrophy. Notably, there is extensive white
matter volume loss surrounding the left lateral ventricle atrium and
occipital horn with ex vacuo dilatation.

Extensive multifocal and confluent T2/FLAIR hyperintense signal
abnormality within the bilateral subcortical/juxtacortical and deep
periventricular white matter. Multiple foci of T2/FLAIR hyperintense
signal abnormality are also present within the pons. Additionally,
there is thinning of the corpus callosum. Findings are compatible
with the provided history of multiple sclerosis.

There are multiple enhancing lesions compatible with active
demyelination as follows. Punctate focus of enhancement within the
posterior left frontal lobe periventricular white matter (series 21,
image 35). 4 mm enhancing lesion within the right frontal lobe
subcortical white matter along the anterior aspect of the sylvian
fissure (series 21, image 26) (series 22, image 21). Subcentimeter
curvilinear focus of enhancement within the right frontal lobe
periventricular white matter (series 22, image 80). Subcentimeter
focus of enhancement within the right callosal splenium (series 22,
image 12). Punctate focus of enhancement within the left parietal
lobe periventricular white matter (series 22, image 10).

No evidence of intracranial mass.

No chronic intracranial blood products.

No extra-axial fluid collection.

No midline shift.

Vascular: Expected proximal arterial flow voids.

Skull and upper cervical spine: No focal marrow lesion.

Sinuses/Orbits: Visualized orbits show no acute finding. Trace
bilateral ethmoid and left maxillary sinus mucosal thickening.
IMPRESSION: Motion degraded examination.

Multifocal white matter lesions with severe involvement of the
cerebral white matter and lesser involvement of the pons, as well as
diffuse thinning of the corpus callosum. Findings are compatible
with the provided history of multiple sclerosis. There are multiple
small enhancing foci within the bilateral cerebral white matter and
corpus callosum, as detailed and compatible with sites of active
demyelination.

Age advanced cerebral atrophy. Notably, there is severe white matter
volume loss along the atrium and occipital horn of the left lateral
ventricle with ex vacuo ventricular dilatation.

## 2021-04-24 MED ORDER — ACETAMINOPHEN 325 MG PO TABS
650.0000 mg | ORAL_TABLET | Freq: Once | ORAL | Status: AC
Start: 2021-04-24 — End: 2021-04-24
  Administered 2021-04-24: 650 mg via ORAL
  Filled 2021-04-24: qty 2

## 2021-04-24 MED ORDER — MORPHINE SULFATE (PF) 4 MG/ML IV SOLN: 4.0000 mg | Freq: Once | INTRAVENOUS | Status: DC

## 2021-04-24 MED ORDER — PREDNISONE 50 MG PO TABS: 625.0000 mg | ORAL_TABLET | Freq: Every day | ORAL | 0 refills | Status: AC

## 2021-04-24 MED ORDER — SODIUM CHLORIDE 0.9 % IV SOLN
1000.0000 mg | Freq: Once | INTRAVENOUS | Status: AC
  Administered 2021-04-24: 1000 mg via INTRAVENOUS
  Filled 2021-04-24: qty 8

## 2021-04-24 MED ORDER — GADOBUTROL 1 MMOL/ML IV SOLN: 9.5000 mL | Freq: Once | INTRAVENOUS | Status: DC | PRN

## 2021-04-24 MED ORDER — GADOBUTROL 1 MMOL/ML IV SOLN
9.5000 mL | Freq: Once | INTRAVENOUS | Status: AC | PRN
  Administered 2021-04-24: 9.5 mL via INTRAVENOUS

## 2021-04-24 MED ORDER — SODIUM CHLORIDE 0.9 % IV BOLUS
500.0000 mL | Freq: Once | INTRAVENOUS | Status: AC
  Administered 2021-04-24: 500 mL via INTRAVENOUS

## 2021-04-24 NOTE — Discharge Instructions (Signed)
You have been seen and discharged from the emergency department.  Take steroids as prescribed.  A referral has been placed to [facility] neurology.  They should contact you to schedule appointment, if you do not hear from them by Wednesday call their office.  Establish care and follow-up with your primary provider for reevaluation and further care. Take home medications as prescribed. If you have any worsening symptoms or further concerns for your health please return to an emergency department for further evaluation.

## 2021-04-24 NOTE — ED Notes (Signed)
Patient transported to MRI 

## 2021-04-24 NOTE — ED Provider Notes (Signed)
[facility] EMERGENCY DEPARTMENT Provider Note   CSN: [id number] Arrival date & time: 04/24/21  1424     History No chief complaint on file.   Sonia Ayala is a 45 y.o. female.  HPI   45 year old female with reported past medical history of MS and seizures presents the emergency department with concern for fatigue and body wide pain.  Patient states she just abruptly moved here from [facility] area.  She has a lot of emotional stressors going on in her life.  Recently patient has been experiencing whole body pain/aches with extreme fatigue.  She states that she periodically has migrating numbness and sensory changes due to her multiple sclerosis disease, she feels as if the symptoms are exaggerated at this time.  She had a headache a couple days ago but currently denies any head or neck pain.  No recent fever, chest pain, shortness of breath, abdominal pain, vomiting/diarrhea.  Past Medical History:  Diagnosis Date  . Multiple sclerosis (HCC)   . Seizures (HCC)     There are no problems to display for this patient.   No past surgical history on file.   OB History   No obstetric history on file.     No family history on file.     Home Medications Prior to Admission medications   Not on File    Allergies    Naproxen and Neurontin [gabapentin]  Review of Systems   Review of Systems  Constitutional: Positive for fatigue. Negative for chills and fever.  HENT: Negative for congestion.   Eyes: Negative for visual disturbance.  Respiratory: Negative for shortness of breath.   Cardiovascular: Negative for chest pain.  Gastrointestinal: Negative for abdominal pain, diarrhea and vomiting.  Genitourinary: Negative for dysuria.  Musculoskeletal: Positive for arthralgias and myalgias.  Skin: Negative for rash.  Neurological: Positive for weakness and numbness. Negative for tremors, facial asymmetry, speech difficulty and headaches.    Physical  Exam Updated Vital Signs BP 111/77 (BP Location: Left Arm)   Pulse 87   Temp 98.4 F (36.9 C)   Resp 16   LMP 04/03/2021   SpO2 100%   Physical Exam Vitals and nursing note reviewed.  Constitutional:      General: She is not in acute distress.    Appearance: Normal appearance. She is not ill-appearing or toxic-appearing.  HENT:     Head: Normocephalic.     Mouth/Throat:     Mouth: Mucous membranes are moist.  Eyes:     Extraocular Movements: Extraocular movements intact.     Pupils: Pupils are equal, round, and reactive to light.  Cardiovascular:     Rate and Rhythm: Normal rate.  Pulmonary:     Effort: Pulmonary effort is normal. No respiratory distress.  Abdominal:     Palpations: Abdomen is soft.     Tenderness: There is no abdominal tenderness.  Musculoskeletal:        General: No swelling.     Cervical back: No rigidity.  Skin:    General: Skin is warm.  Neurological:     Mental Status: She is alert and oriented to person, place, and time. Mental status is at baseline.     Comments: Neuro exam appears very inconsistent, she is not ataxic in the upper extremities, sensory exam shows decreased/changed sensation diffusely in the upper and lower extremities that is changing even mid exam, does not have any facial asymmetry or cranial nerve deficits  Psychiatric:  Mood and Affect: Mood normal.     ED Results / Procedures / Treatments   Labs (all labs ordered are listed, but only abnormal results are displayed) Labs Reviewed  CBC WITH DIFFERENTIAL/PLATELET  BASIC METABOLIC PANEL  I-STAT BETA HCG BLOOD, ED ([facility], [facility], [facility] ONLY)    EKG None  Radiology No results found.  Procedures Procedures   Medications Ordered in ED Medications  sodium chloride 0.9 % bolus 500 mL (has no administration in time range)  acetaminophen (TYLENOL) tablet 650 mg (has no administration in time range)    ED Course  I have reviewed the triage vital signs and the nursing  notes.  Pertinent labs & imaging results that were available during my care of the patient were reviewed by me and considered in my medical decision making (see chart for details).    MDM Rules/Calculators/A&P                          45 year old female presents the emergency department with body wide pain and fatigue.  Patient has a self-reported history of multiple sclerosis, states that she just recently moved here from [facility].  She has baseline scattered numbness and sensory changes related to her MS disease which has been more pronounced recently.  No headache, no fever.  Besides inconsistent subjective sensory changes she does not appear to have acute neurodeficit on exam.  Patient was seen in this department prior for the same complaint.  She left without being seen multiple times, was seen once by her provider 2 days ago with similar complaints, blood work seemed appropriate.  Looking in care everywhere patient does appear to have a documented history of multiple sclerosis.  MRI imaging today shows signs consistent with MS with active demyelinating disease.  Spoke with on-call neurology, they recommend 1 g of Solu-Medrol IV with 3 days of high-dose steroids.  I have put in an ambulatory return Ear[facility] to [facility] neurology at their request so that she can establish neurology care here.  Vitals remained stable, will treat the patient for an MS exacerbation and refer to outpatient.  Patient will be discharged and treated as an outpatient.  Discharge plan and strict return to ED precautions discussed, patient verbalizes understanding and agreement.  Final Clinical Impression(s) / ED Diagnoses Final diagnoses:  None    Rx / DC Orders ED Discharge Orders    None       Rozelle Logan, DO 04/24/21 2155

## 2021-04-24 NOTE — ED Triage Notes (Signed)
Pt reports generalized pain and weakness x 10 years. Hx of MS. Reports she's under more stress than normal recently which has made these symptoms worse. LWBS x 2 this week for same.

## 2021-04-25 ENCOUNTER — Encounter (HOSPITAL_COMMUNITY): Payer: Self-pay

## 2021-04-25 ENCOUNTER — Emergency Department (HOSPITAL_COMMUNITY)
Admission: EM | Admit: 2021-04-25 | Discharge: 2021-04-26 | Disposition: A | Discharge status: Home / Self Care | Payer: 59 | Attending: Emergency Medicine | Admitting: Emergency Medicine

## 2021-04-25 ENCOUNTER — Other Ambulatory Visit: Payer: Self-pay

## 2021-04-25 DIAGNOSIS — M549 Dorsalgia, unspecified: Secondary | ICD-10-CM | POA: Diagnosis present

## 2021-04-25 DIAGNOSIS — Z5321 Procedure and treatment not carried out due to patient leaving prior to being seen by health care provider: Secondary | ICD-10-CM | POA: Insufficient documentation

## 2021-04-25 DIAGNOSIS — Z5900 Homelessness unspecified: Secondary | ICD-10-CM | POA: Insufficient documentation

## 2021-04-25 DIAGNOSIS — M545 Low back pain, unspecified: Secondary | ICD-10-CM | POA: Diagnosis not present

## 2021-04-25 NOTE — ED Triage Notes (Signed)
Patient BIB [facility], seen last night for same, hx of MS, told she had new lesions and noted neurology follow-up, patient denies any new symptoms, reports she is here because her boyfriend is here.
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
## 2021-04-26 ENCOUNTER — Other Ambulatory Visit: Payer: Self-pay

## 2021-04-26 ENCOUNTER — Emergency Department (HOSPITAL_COMMUNITY)
Admission: EM | Admit: 2021-04-26 | Discharge: 2021-04-27 | Disposition: A | Discharge status: Home / Self Care | Payer: 59 | Source: Home / Self Care | Attending: Emergency Medicine | Admitting: Emergency Medicine

## 2021-04-26 DIAGNOSIS — M545 Low back pain, unspecified: Secondary | ICD-10-CM | POA: Insufficient documentation

## 2021-04-26 DIAGNOSIS — Z5900 Homelessness unspecified: Secondary | ICD-10-CM | POA: Insufficient documentation

## 2021-04-26 LAB — CBC WITH DIFFERENTIAL/PLATELET
Abs Immature Granulocytes: 0.02 10*3/uL (ref 0.00–0.07)
Basophils Absolute: 0 10*3/uL (ref 0.0–0.1)
Basophils Relative: 0 %
Eosinophils Absolute: 0 10*3/uL (ref 0.0–0.5)
Eosinophils Relative: 0 %
HCT: 41.1 % (ref 36.0–46.0)
Hemoglobin: 13.6 g/dL (ref 12.0–15.0)
Immature Granulocytes: 1 %
Lymphocytes Relative: 12 %
Lymphs Abs: 0.4 10*3/uL — ABNORMAL LOW (ref 0.7–4.0)
MCH: 30.9 pg (ref 26.0–34.0)
MCHC: 33.1 g/dL (ref 30.0–36.0)
MCV: 93.4 fL (ref 80.0–100.0)
Monocytes Absolute: 0.5 10*3/uL (ref 0.1–1.0)
Monocytes Relative: 14 %
Neutro Abs: 2.6 10*3/uL (ref 1.7–7.7)
Neutrophils Relative %: 73 %
Platelets: 257 10*3/uL (ref 150–400)
RBC: 4.4 MIL/uL (ref 3.87–5.11)
RDW: 13.2 % (ref 11.5–15.5)
WBC: 3.6 10*3/uL — ABNORMAL LOW (ref 4.0–10.5)
nRBC: 0 % (ref 0.0–0.2)

## 2021-04-26 NOTE — ED Provider Notes (Signed)
Emergency Medicine Provider Triage Evaluation Note  Sonia Ayala , a 45 y.o. female  was evaluated in triage.  Pt complains of extreme fatigue, trouble sleeping.  Patient was seen in the emergency department 2 days ago and had MRI of the brain done which showed active MS. she also has back pain.  Review of Systems  Positive: Fatigue, back pain Negative: Fever  Physical Exam  BP 122/83 (BP Location: Left Arm)   Pulse 68   Temp 99.1 F (37.3 C) (Oral)   Resp 16   LMP 04/03/2021   SpO2 97%  Gen:   Lethargic but arousable, drifts off while I talk to her, no distress  Resp:  Normal effort Neuro:  Lethargic but oriented, gross movement intact  Other:    Medical Decision Making  Medically screening exam initiated at 11:27 PM.  Appropriate orders placed.  Sonia Ayala was informed that the remainder of the evaluation will be completed by another provider, this initial triage assessment does not replace that evaluation, and the importance of remaining in the ED until their evaluation is complete.     Renne Crigler, PA-C 04/26/21 2329    Mesner, Barbara Cower, MD 04/27/21 [phone]

## 2021-04-26 NOTE — ED Notes (Signed)
Pt called for vitals x1 with no response 

## 2021-04-26 NOTE — ED Notes (Signed)
Pt called x2 with no response and not visible in lobby

## 2021-04-26 NOTE — ED Triage Notes (Addendum)
Pt c/o back pain after a fall yesterday evening. Was seen her yesterday but wants to be re-evaluated for continued pain, hx MS. Pt extremely drowsy during triage, ask patient if there was anything she took to make her drowsy and patient denies. Had to repeatedly wake patient up to complete triage.

## 2021-04-27 DIAGNOSIS — M545 Low back pain, unspecified: Secondary | ICD-10-CM | POA: Diagnosis not present

## 2021-04-27 LAB — COMPREHENSIVE METABOLIC PANEL
ALT: 39 U/L (ref 0–44)
AST: 23 U/L (ref 15–41)
Albumin: 3.9 g/dL (ref 3.5–5.0)
Alkaline Phosphatase: 51 U/L (ref 38–126)
Anion gap: 10 (ref 5–15)
BUN: 11 mg/dL (ref 6–20)
CO2: 24 mmol/L (ref 22–32)
Calcium: 8.7 mg/dL — ABNORMAL LOW (ref 8.9–10.3)
Chloride: 105 mmol/L (ref 98–111)
Creatinine, Ser: 0.45 mg/dL (ref 0.44–1.00)
GFR, Estimated: >60 mL/min (ref >60)
Glucose, Bld: 106 mg/dL — ABNORMAL HIGH (ref 70–99)
Potassium: 3.4 mmol/L — ABNORMAL LOW (ref 3.5–5.1)
Sodium: 139 mmol/L (ref 135–145)
Total Bilirubin: 0.4 mg/dL (ref 0.3–1.2)
Total Protein: 6.5 g/dL (ref 6.5–8.1)

## 2021-04-27 MED ORDER — PREDNISONE 50 MG PO TABS
600.0000 mg | ORAL_TABLET | Freq: Once | ORAL | Status: AC
  Administered 2021-04-27: 600 mg via ORAL
  Filled 2021-04-27: qty 12

## 2021-04-27 MED ORDER — ACETAMINOPHEN 325 MG PO TABS
650.0000 mg | ORAL_TABLET | Freq: Once | ORAL | Status: AC
  Administered 2021-04-27: 650 mg via ORAL
  Filled 2021-04-27: qty 2

## 2021-04-27 NOTE — ED Notes (Signed)
Discharge instructions printed and reviewed with the patient.  Awaiting for TOC to see the patient prior to discharge

## 2021-04-27 NOTE — ED Notes (Signed)
Ambulatory to the bathroom- tolerates well

## 2021-04-27 NOTE — ED Provider Notes (Signed)
[facility]-EMERGENCY DEPT Provider Note   CSN: [id number] Arrival date & time: 04/26/21  2233     History Chief Complaint  Patient presents with  . Back Pain    Sonia Ayala is a 45 y.o. female.  HPI Patient is 45 year old female presenting today with back pain.  On my evaluation she is sleeping in hallway bed.  She arouses quickly to verbal stimulus.  She is primarily discussing her living situation with me.  She states she is homeless and does not know where she can take a shower in this area.  She states she has not been taking her medication she was prescribed at the last visit because she has not filled it.  Endorses back pain states it is achy constant 5/10.  No fevers or chills.  No numbness or weakness of her lower extremities.  She states that since her MRI her symptoms have been relatively unchanged.     Past Medical History:  Diagnosis Date  . Multiple sclerosis (HCC)   . Seizures (HCC)     There are no problems to display for this patient.   No past surgical history on file.   OB History   No obstetric history on file.     No family history on file.     Home Medications Prior to Admission medications   Medication Sig Start Date End Date Taking? Authorizing Provider  predniSONE (DELTASONE) 50 MG tablet Take 12.5 tablets (625 mg total) by mouth daily for 3 days. 04/24/21 04/27/21  Horton, Clabe Seal, DO    Allergies    Naproxen and Neurontin [gabapentin]  Review of Systems   Review of Systems  Constitutional: Negative for chills and fever.  HENT: Negative for congestion.   Eyes: Negative for pain.  Respiratory: Negative for cough and shortness of breath.   Cardiovascular: Negative for chest pain and leg swelling.  Gastrointestinal: Negative for abdominal distention, abdominal pain, nausea and vomiting.  Genitourinary: Negative for dysuria.  Musculoskeletal: Positive for back pain. Negative for myalgias.       Back  pain  Skin: Negative for rash.  Neurological: Negative for dizziness and headaches.    Physical Exam Updated Vital Signs BP 109/79   Pulse 65   Temp 97.6 F (36.4 C) (Oral)   Resp 15   Ht 5\' 5"  (1.651 m)   Wt 72.6 kg   LMP 04/03/2021   SpO2 99%   BMI 26.63 kg/m   Physical Exam Vitals and nursing note reviewed.  Constitutional:      General: She is not in acute distress.    Comments: Asleep, awakens to verbal stimulus.  Alert and oriented and able answer questions appropriately follow commands.  Pleasant.  Somewhat disheveled appearing.  HENT:     Head: Normocephalic and atraumatic.     Nose: Nose normal.  Eyes:     General: No scleral icterus. Cardiovascular:     Rate and Rhythm: Normal rate and regular rhythm.     Pulses: Normal pulses.     Heart sounds: Normal heart sounds.  Pulmonary:     Effort: Pulmonary effort is normal. No respiratory distress.     Breath sounds: No wheezing.  Abdominal:     Palpations: Abdomen is soft.     Tenderness: There is no abdominal tenderness. There is no guarding or rebound.  Musculoskeletal:     Cervical back: Normal range of motion.     Right lower leg: No edema.  Left lower leg: No edema.     Comments: Strength bilateral lower extremities 5/5.  No tenderness to palpation of midline spine.  Full range of motion of lower extremities.  Skin:    General: Skin is warm and dry.     Capillary Refill: Capillary refill takes less than 2 seconds.  Neurological:     Mental Status: She is alert. Mental status is at baseline.  Psychiatric:        Mood and Affect: Mood normal.        Behavior: Behavior normal.     ED Results / Procedures / Treatments   Labs (all labs ordered are listed, but only abnormal results are displayed) Labs Reviewed  CBC WITH DIFFERENTIAL/PLATELET - Abnormal; Notable for the following components:      Result Value   WBC 3.6 (*)    Lymphs Abs 0.4 (*)    All other components within normal limits   COMPREHENSIVE METABOLIC PANEL - Abnormal; Notable for the following components:   Potassium 3.4 (*)    Glucose, Bld 106 (*)    Calcium 8.7 (*)    All other components within normal limits    EKG None  Radiology No results found.  Procedures Procedures   Medications Ordered in ED Medications  predniSONE (DELTASONE) tablet 600 mg (has no administration in time range)  acetaminophen (TYLENOL) tablet 650 mg (650 mg Oral Given 04/27/21 0815)    ED Course  I have reviewed the triage vital signs and the nursing notes.  Pertinent labs & imaging results that were available during my care of the patient were reviewed by me and considered in my medical decision making (see chart for details).    MDM Rules/Calculators/A&P                          Patient is a 45 year old female with past medical history significant for MS.  She states that she is from [place] but relocated to [place] recently--unable to explain why--and states that she is not homeless in the area.  She was seen 3 days ago for back pain and had MRI which was unremarkable.  She was treated empirically for MS flare with steroids.  She has not filled this prescription.  On my examination she is asleep but awakens with verbal stimulus.  She is well-appearing no acute distress normal vital signs.  She is primarily asking for a shower.  Discussed with transitional care team who will provide her with bus pass to get to [facility].  Given 1 dose of her steroids that she was prescribed here.  Will discharge at this time.  Given 1 dose of Tylenol as well.  Ambulatory at this time.  Final Clinical Impression(s) / ED Diagnoses Final diagnoses:  Low back pain without sciatica, unspecified back pain laterality, unspecified chronicity    Rx / DC Orders ED Discharge Orders    None       Gailen Shelter, [place] 04/27/21 6599    Sabino Donovan, MD 04/28/21 1353

## 2021-04-27 NOTE — ED Notes (Addendum)
Pt called for rooming x2. No answer 

## 2021-04-27 NOTE — Discharge Instructions (Signed)
Please fill your prescription for the medications you have been prescribed.  Please drink plenty of water.  You may use Tylenol 1000 mg every 6 hours for pain.

## 2021-04-27 NOTE — Progress Notes (Signed)
..  Transition of Care [facility]) - Emergency Department Mini Assessment   Patient Details  Name: Sonia Ayala MRN: [id number] Date of Birth: [birth year]  Transition of Care [facility]) CM/SW Contact:    Tavin Vernet C Tarpley-Carter, LCSWA Phone Number: 04/27/2021, 8:46 AM   Clinical Narrative: TOC CSW consulted with pt.  Pt is having a difficulty time with housing/shelter due to her pt being admitted to hospital.  CSW assisted pt with getting to [facility] to assist her with her search for resources.  CSW explained RIDER WAIVER to pt.  Pt signed RIDER WAIVER.  CSW contacted transportation for pt to [facility].  Bessye Stith Tarpley-Carter, MSW, LCSW-A Pronouns:  She, Her, Hers                  [facility] Transitions of CareClinical Social Worker [email or web address] [phone]   ED Mini Assessment: What brought you to the Emergency Department? : Back Pain  Barriers to Discharge: No Barriers Identified     Means of departure: Car  Interventions which prevented an admission or readmission: Homeless Screening,Transportation Screening    Patient Contact and Communications       Contact Date: 04/27/21,          Patient states their goals for this hospitalization and ongoing recovery are:: Pt wants assistance with homeless resources and transportation.   Choice offered to / list presented to : NA  Admission diagnosis:  back pain There are no problems to display for this patient.  PCP:  Pcp, No Pharmacy:  No Pharmacies Listed

## 2021-05-10 ENCOUNTER — Observation Stay (HOSPITAL_COMMUNITY)
Admission: EM | Admit: 2021-05-10 | Discharge: 2021-05-10 | Disposition: A | Discharge status: Home / Self Care | Payer: 59 | Attending: Family Medicine | Admitting: Family Medicine

## 2021-05-10 ENCOUNTER — Ambulatory Visit: Admission: EM | Admit: 2021-05-10 | Discharge: 2021-05-10 | Payer: 59

## 2021-05-10 ENCOUNTER — Emergency Department (HOSPITAL_COMMUNITY): Payer: 59

## 2021-05-10 ENCOUNTER — Encounter (HOSPITAL_COMMUNITY): Payer: Self-pay | Admitting: *Deleted

## 2021-05-10 DIAGNOSIS — R5383 Other fatigue: Secondary | ICD-10-CM | POA: Diagnosis present

## 2021-05-10 DIAGNOSIS — Z20822 Contact with and (suspected) exposure to covid-19: Secondary | ICD-10-CM | POA: Diagnosis not present

## 2021-05-10 DIAGNOSIS — G35 Multiple sclerosis: Secondary | ICD-10-CM | POA: Diagnosis not present

## 2021-05-10 DIAGNOSIS — G9341 Metabolic encephalopathy: Secondary | ICD-10-CM

## 2021-05-10 DIAGNOSIS — Z79899 Other long term (current) drug therapy: Secondary | ICD-10-CM | POA: Insufficient documentation

## 2021-05-10 DIAGNOSIS — R269 Unspecified abnormalities of gait and mobility: Principal | ICD-10-CM | POA: Insufficient documentation

## 2021-05-10 DIAGNOSIS — F17200 Nicotine dependence, unspecified, uncomplicated: Secondary | ICD-10-CM | POA: Diagnosis not present

## 2021-05-10 LAB — CBC WITH DIFFERENTIAL/PLATELET
Abs Immature Granulocytes: 0.04 10*3/uL (ref 0.00–0.07)
Basophils Absolute: 0 10*3/uL (ref 0.0–0.1)
Basophils Relative: 0 %
Eosinophils Absolute: 0 10*3/uL (ref 0.0–0.5)
Eosinophils Relative: 0 %
HCT: 40 % (ref 36.0–46.0)
Hemoglobin: 13.5 g/dL (ref 12.0–15.0)
Immature Granulocytes: 1 %
Lymphocytes Relative: 5 %
Lymphs Abs: 0.4 10*3/uL — ABNORMAL LOW (ref 0.7–4.0)
MCH: 31.2 pg (ref 26.0–34.0)
MCHC: 33.8 g/dL (ref 30.0–36.0)
MCV: 92.4 fL (ref 80.0–100.0)
Monocytes Absolute: 0.3 10*3/uL (ref 0.1–1.0)
Monocytes Relative: 4 %
Neutro Abs: 7.1 10*3/uL (ref 1.7–7.7)
Neutrophils Relative %: 90 %
Platelets: 172 10*3/uL (ref 150–400)
RBC: 4.33 MIL/uL (ref 3.87–5.11)
RDW: 13.3 % (ref 11.5–15.5)
WBC: 7.8 10*3/uL (ref 4.0–10.5)
nRBC: 0 % (ref 0.0–0.2)

## 2021-05-10 LAB — COMPREHENSIVE METABOLIC PANEL
ALT: 41 U/L (ref 0–44)
AST: 23 U/L (ref 15–41)
Albumin: 4 g/dL (ref 3.5–5.0)
Alkaline Phosphatase: 60 U/L (ref 38–126)
Anion gap: 6 (ref 5–15)
BUN: 7 mg/dL (ref 6–20)
CO2: 24 mmol/L (ref 22–32)
Calcium: 8.8 mg/dL — ABNORMAL LOW (ref 8.9–10.3)
Chloride: 106 mmol/L (ref 98–111)
Creatinine, Ser: 0.6 mg/dL (ref 0.44–1.00)
GFR, Estimated: >60 mL/min (ref >60)
Glucose, Bld: 101 mg/dL — ABNORMAL HIGH (ref 70–99)
Potassium: 3.9 mmol/L (ref 3.5–5.1)
Sodium: 136 mmol/L (ref 135–145)
Total Bilirubin: 1.1 mg/dL (ref 0.3–1.2)
Total Protein: 6.8 g/dL (ref 6.5–8.1)

## 2021-05-10 LAB — URINALYSIS, ROUTINE W REFLEX MICROSCOPIC
Bacteria, UA: NONE SEEN
Bilirubin Urine: NEGATIVE
Glucose, UA: 50 mg/dL — AB
Ketones, ur: NEGATIVE mg/dL
Leukocytes,Ua: NEGATIVE
Nitrite: NEGATIVE
Protein, ur: NEGATIVE mg/dL
Specific Gravity, Urine: 1.021 (ref 1.005–1.030)
pH: 7 (ref 5.0–8.0)

## 2021-05-10 LAB — BLOOD GAS, VENOUS
Acid-Base Excess: 1.6 mmol/L (ref 0.0–2.0)
Bicarbonate: 24.8 mmol/L (ref 20.0–28.0)
FIO2: 21
O2 Saturation: 62 %
Patient temperature: 37
pCO2, Ven: 40.8 mmHg — ABNORMAL LOW (ref 44.0–60.0)
pH, Ven: 7.416 (ref 7.250–7.430)
pO2, Ven: 33.8 mmHg (ref 32.0–45.0)

## 2021-05-10 LAB — ACETAMINOPHEN LEVEL: Acetaminophen (Tylenol), Serum: <10 ug/mL — ABNORMAL LOW (ref 10–30)

## 2021-05-10 LAB — RAPID URINE DRUG SCREEN, HOSP PERFORMED
Amphetamines: NOT DETECTED
Barbiturates: NOT DETECTED
Benzodiazepines: NOT DETECTED
Cocaine: NOT DETECTED
Opiates: NOT DETECTED
Tetrahydrocannabinol: POSITIVE — AB

## 2021-05-10 LAB — RESP PANEL BY RT-PCR (FLU A&B, COVID) ARPGX2
Influenza A by PCR: NEGATIVE
Influenza B by PCR: NEGATIVE
SARS Coronavirus 2 by RT PCR: NEGATIVE

## 2021-05-10 LAB — SALICYLATE LEVEL: Salicylate Lvl: <7 mg/dL — ABNORMAL LOW (ref 7.0–30.0)

## 2021-05-10 LAB — AMMONIA: Ammonia: 28 umol/L (ref 9–35)

## 2021-05-10 LAB — PREGNANCY, URINE: Preg Test, Ur: NEGATIVE

## 2021-05-10 IMAGING — CT CT HEAD W/O CM
3 series · 16 of 47 positions shown, 19 images · non-contrast
Comparison: [DATE]

CLINICAL DATA: Slurred speech, history of multiple sclerosis

EXAM:
CT HEAD WITHOUT CONTRAST
TECHNIQUE: Contiguous axial images were obtained from the base of the skull
through the vertex without intravenous contrast.

[Series 3: head w o · axial · 0.42mm/px · z∈[+1419,+1554]mm · 10 of 33 slices shown, 13 images]
[im 3/33  brain]
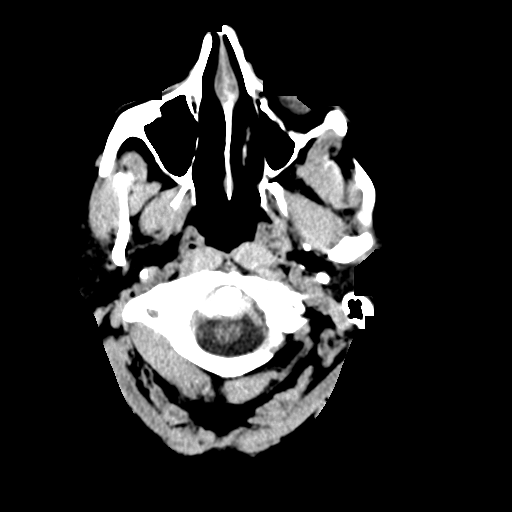
[im 3/33  bone]
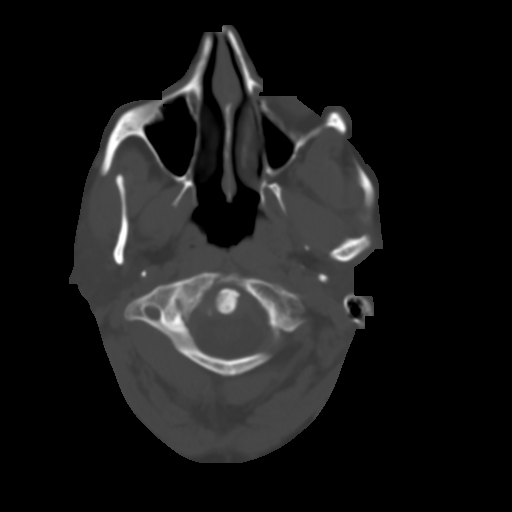
[im 6/33  brain]
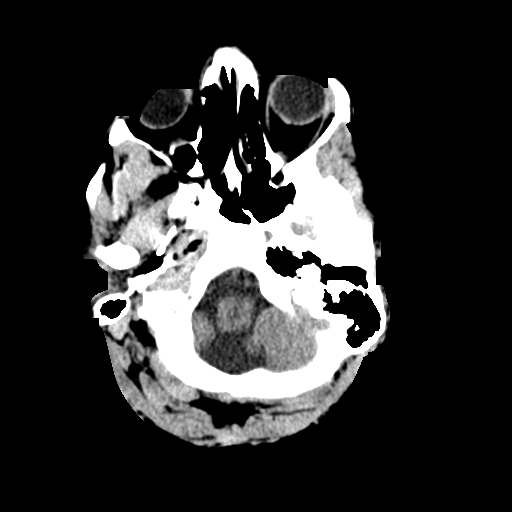
[im 9/33  brain]
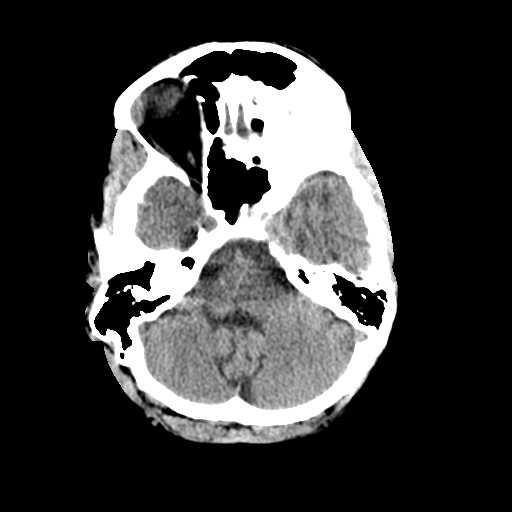
[im 12/33  brain]
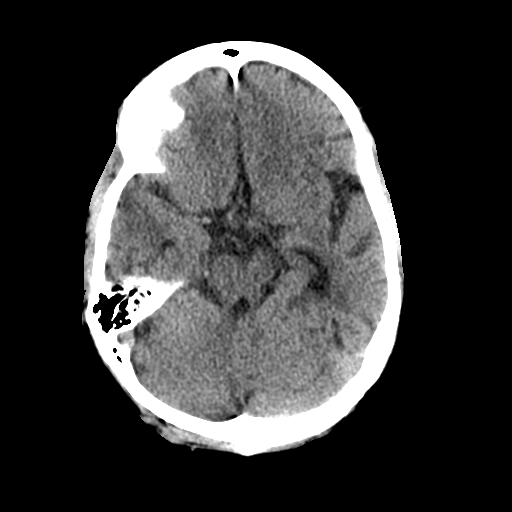
[im 15/33  brain]
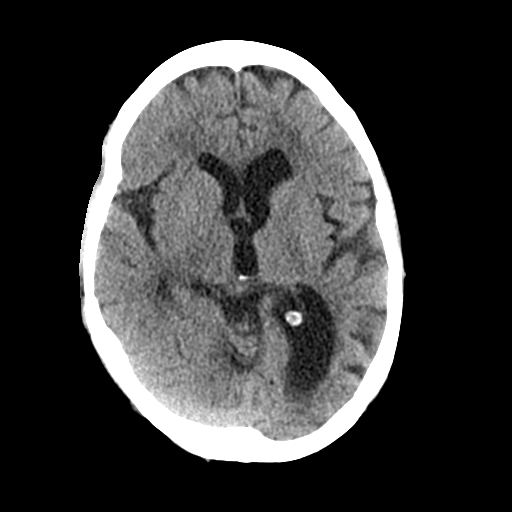
[im 15/33  bone]
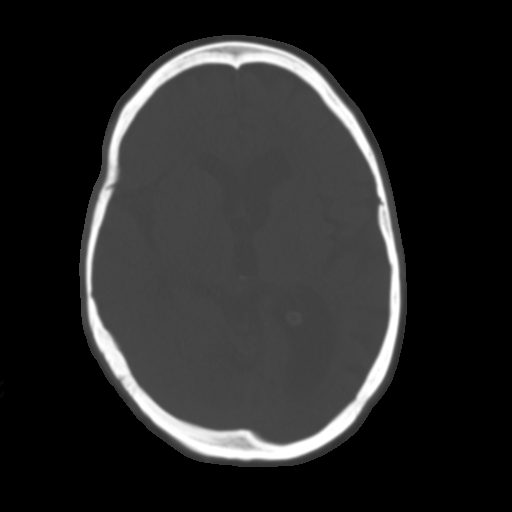
[im 18/33  brain]
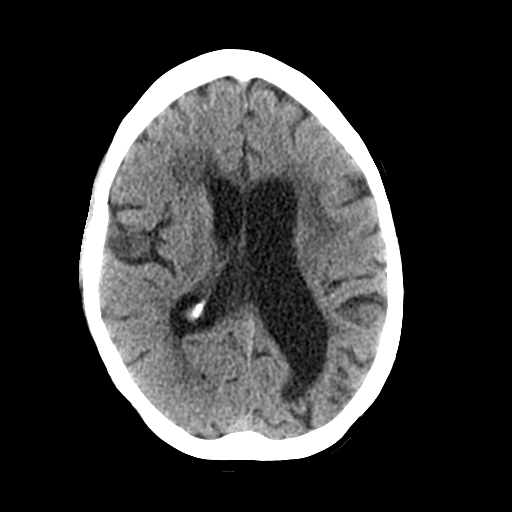
[im 21/33  brain]
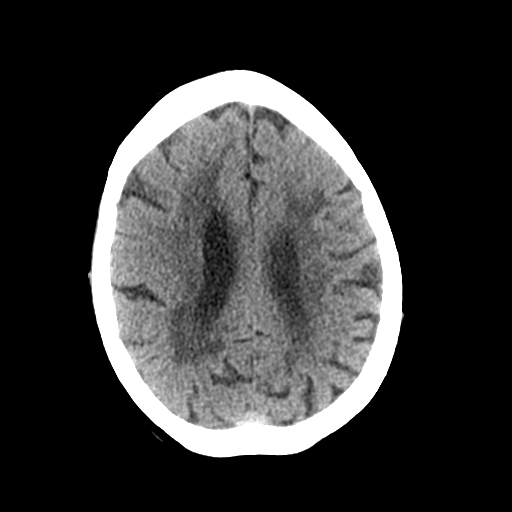
[im 25/33  brain]
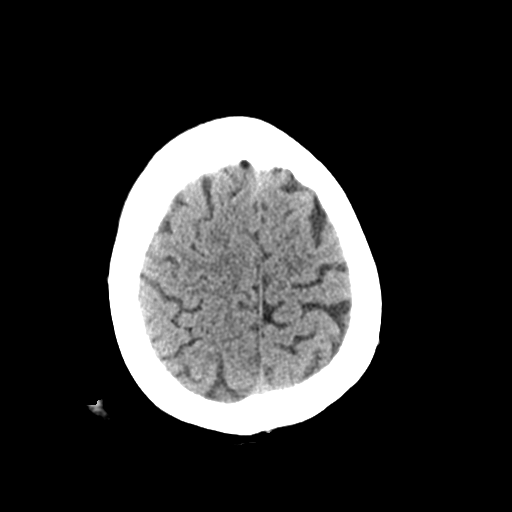
[im 27/33  brain]
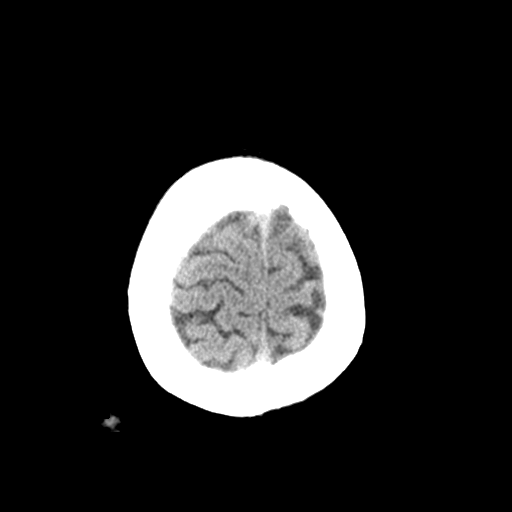
[im 27/33  bone]
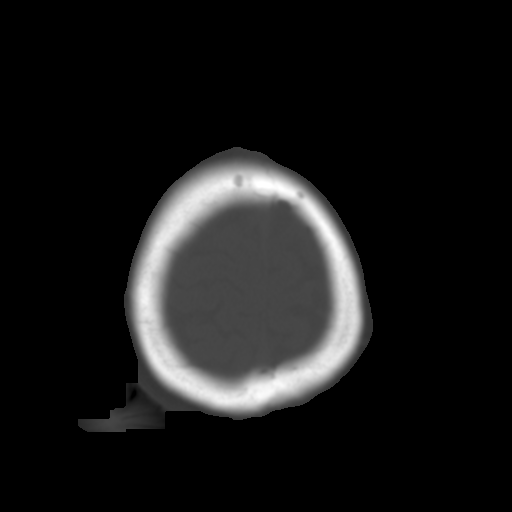
[im 30/33  brain]
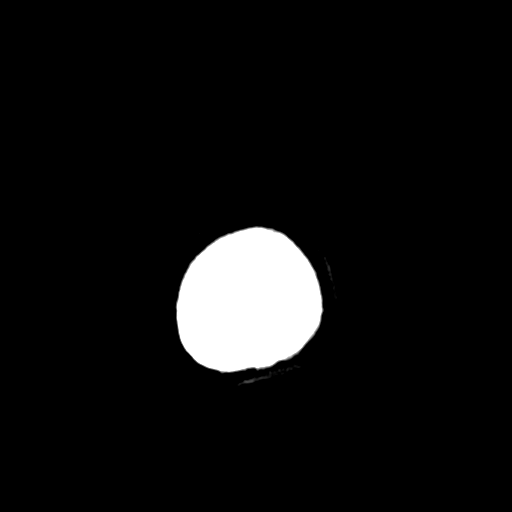

[Series 4: coronal soft · coronal · 0.33mm/px · 3 of 69 slices shown]
[im 23/69  brain]
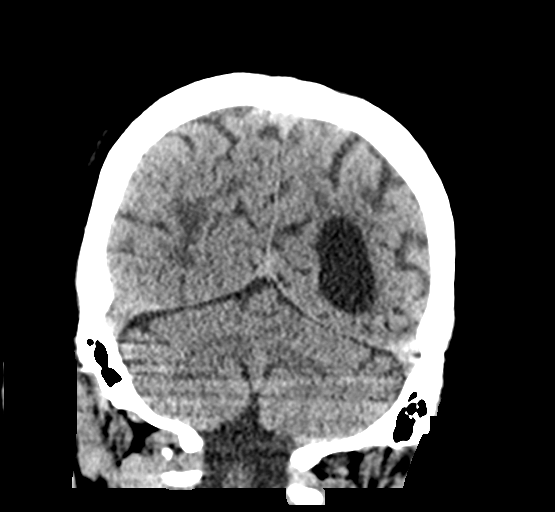
[im 31/69  brain]
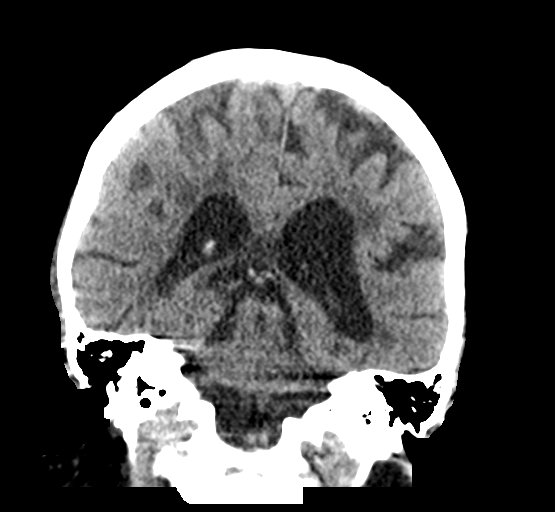
[im 38/69  brain]
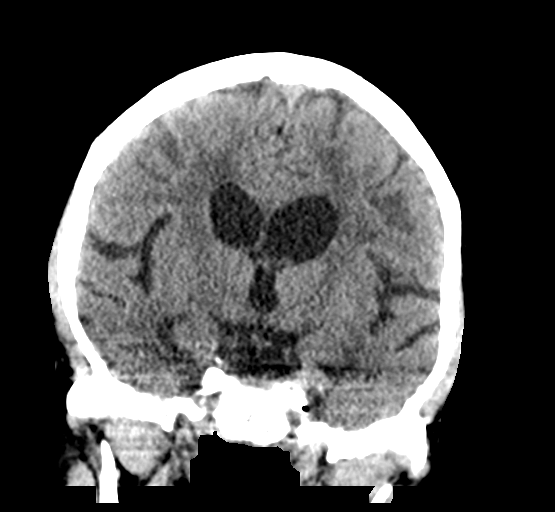

[Series 5: sagittal soft · sagittal · 0.33mm/px · 3 of 56 slices shown]
[im 19/56  brain]
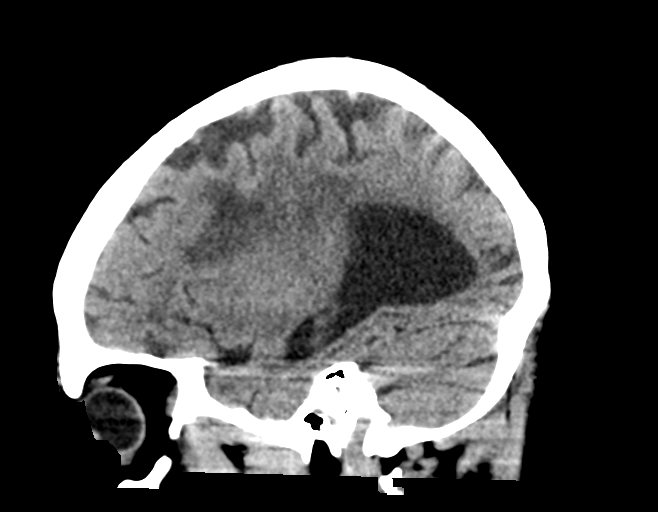
[im 28/56  brain]
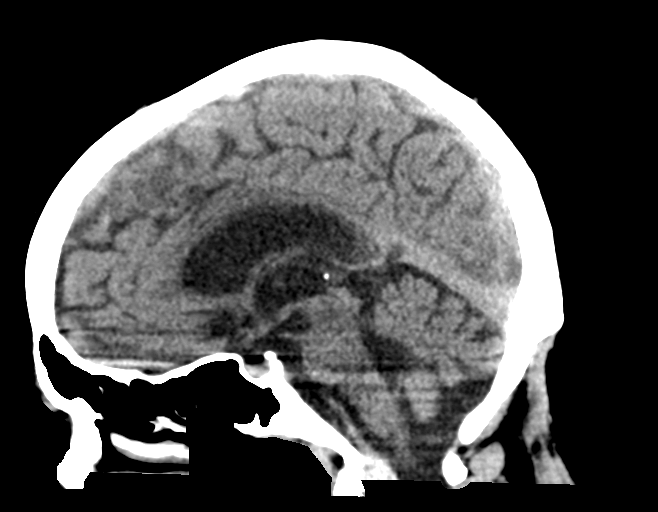
[im 37/56  brain]
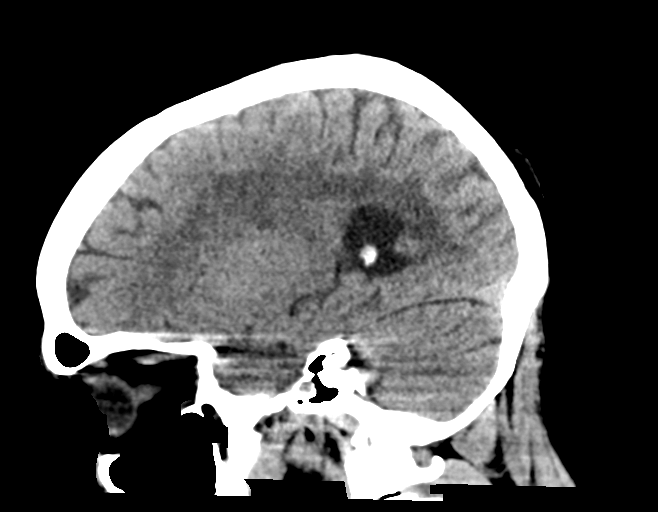

[16 of 47 positions shown; findings below may reference images not displayed]

FINDINGS: Brain: No evidence of acute infarction, hemorrhage, hydrocephalus,
extra-axial collection or mass lesion/mass effect. Chronic white
matter ischemic change is noted. Mild atrophic changes are noted as
well.

Vascular: No hyperdense vessel or unexpected calcification.

Skull: Normal. Negative for fracture or focal lesion.

Sinuses/Orbits: No acute finding.

Other: None.
IMPRESSION: Chronic atrophic and ischemic changes without acute abnormality.

## 2021-05-10 MED ORDER — SODIUM CHLORIDE 0.9 % IV SOLN
1000.0000 mg | Freq: Once | INTRAVENOUS | Status: DC
  Filled 2021-05-10: qty 8

## 2021-05-10 MED ORDER — ACETAMINOPHEN 325 MG PO TABS: 650.0000 mg | ORAL_TABLET | Freq: Once | ORAL | Status: DC

## 2021-05-10 MED ORDER — METHYLPREDNISOLONE SODIUM SUCC 40 MG IJ SOLR: 1000.0000 mg | Freq: Once | INTRAMUSCULAR | Status: DC

## 2021-05-10 MED ORDER — MORPHINE SULFATE (PF) 4 MG/ML IV SOLN
4.0000 mg | Freq: Once | INTRAVENOUS | Status: DC
  Filled 2021-05-10: qty 1

## 2021-05-10 MED ORDER — METHYLPREDNISOLONE SODIUM SUCC 1000 MG IJ SOLR
INTRAMUSCULAR | Status: AC
  Filled 2021-05-10: qty 8
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
## 2021-05-10 NOTE — H&P (Signed)
TRH H&P    Patient Demographics:    Sonia Ayala, is a 45 y.o. female  MRN: [id number]  DOB - February 28, 1976  Admit Date - 05/10/2021  Referring MD/NP/PA: Uvaldo Rising  Outpatient Primary MD for the patient is Pcp, No  Patient coming from: Home  Chief complaint- unsteady gait   HPI:    Sonia Ayala  is a 45 y.o. female, with history of seizures and MS presents to the emergency department for complaint of unsteady gait.  Unfortunately patient is not able to provide any history to me as she is too somnolent, has fallen asleep with her meal tray in her lap, wakes up briefly to sternal rub and then goes back to sleep.  ED provider reports that she has been in his mental status intermittently through her emergency room visit.  He reports that 20 minutes ago she was up walking around, yelling at staff, and then immediately back to somnolence.  From his report, patient has recently moved down to this area and is homeless.  She has a history of MS, and reports that she is currently in a flareup.  She describes her flareup was having unsteady gait and full body pain.  To him, she denied headaches, change in vision, paresthesias or weakness, recent loss of consciousness or head injury.  She denied IV drug use.  UDS in the ED shows THC.  She reported no other complaints.  ED provider.  She was in the ER approximately 2 weeks ago.  At that time she had presented for back pain.  She was somnolent then but aroused to verbal stimulus.  At that time she reported that she had not been taking her medications as prescribed.  2 days prior to that visit to the ER she presented to the ED for fatigue and full body pain.  At that time she reported periodic migrating numbness and sensory changes that are consistent with her multiple sclerosis.  She had an MRI of her brain that was limited by motion, but showed multifocal white matter lesions with  severe involvement of the cerebral white matter and lesser involvement of the pons, as well as diffuse thinning of the corpus callosum.  Today, neurology reviewed this MRI and recommends admission for high-dose steroids.  We do have neurology back starting tomorrow, so patient is safe to be admitted here at [facility].  In the ED Patient is afebrile with a temp of 98.5, heart rate 49-80, respiratory rate 18-19, blood pressure 107/41 White blood cell count 7.8, hemoglobin 13.5 Chemistry panel is unremarkable CT head without contrast shows chronic atrophic and ischemic changes without acute abnormality Admission requested for treatment of MS flare       Review of systems:    Unfortunately review of systems cannot be obtained secondary to patient's altered mental status    Past History of the following :    Past Medical History:  Diagnosis Date  . Multiple sclerosis (HCC)   . Seizures (HCC)       History reviewed. No pertinent surgical history.  Social History:      Social History   Tobacco Use  . Smoking status: Current Every Day Smoker  . Smokeless tobacco: Never Used  Substance Use Topics  . Alcohol use: Not Currently       Family History :    No family history on file. Family history could not be reviewed secondary to patient's altered mental status   Home Medications:   Prior to Admission medications   Medication Sig Start Date End Date Taking? Authorizing Provider  GILENYA 0.5 MG CAPS Take 1 capsule by mouth daily. 04/01/21  Yes [provider]  HYDROcodone-acetaminophen (NORCO) 7.5-325 MG tablet Take 1 tablet by mouth 2 (two) times daily as needed for moderate pain. 04/16/21  Yes [provider]  pregabalin (LYRICA) 100 MG capsule Take 100 mg by mouth every 8 (eight) hours as needed (pain). 03/29/21  Yes [provider]  traMADol (ULTRAM) 50 MG tablet Take 50 mg by mouth every 8 (eight) hours as needed for moderate pain. 12/15/20  Yes  [provider]     Allergies:     Allergies  Allergen Reactions  . Naproxen Hives  . Neurontin [Gabapentin]     rash     Physical Exam:   Vitals  Blood pressure 107/71, pulse (!) 57, temperature 98.5 F (36.9 C), temperature source Oral, resp. rate 18, height 5\' 5"  (1.651 m), weight 77.1 kg, SpO2 100 %.  1.  General: Patient is lying supine in bed with head of bed elevated, no acute distress  2. Psychiatric: Patient is currently nonverbal, psychiatric exam could not be assessed  3. Neurologic: Patient opens eyes to painful stimuli briefly, immediately back to sleep, gag reflex intact, protecting airway, withdraws to pain from all 4 extremities, currently nonverbal  4. HEENMT:  Head is atraumatic, normocephalic, pupils are reactive to light, neck is supple, trachea is midline, mucous membranes are moist  5. Respiratory : Lungs are clear to auscultation bilaterally without wheezes, rhonchi, rales, no clubbing, no cyanosis  6. Cardiovascular : Heart rate is normal, rhythm is regular, no murmurs rubs or gallops, no peripheral edema  7. Gastrointestinal:  Abdomen is soft, nondistended, nontender to palpation with active bowel sounds  8. Skin:  Skin is warm dry and intact without acute lesions or rashes on limited exam  9.Musculoskeletal:  No acute deformity, no calf tenderness, peripheral pulses palpated no peripheral edema    Data Review:    CBC Recent Labs  Lab 05/10/21 1734  WBC 7.8  HGB 13.5  HCT 40.0  PLT 172  MCV 92.4  MCH 31.2  MCHC 33.8  RDW 13.3  LYMPHSABS 0.4*  MONOABS 0.3  EOSABS 0.0  BASOSABS 0.0   ------------------------------------------------------------------------------------------------------------------  Results for orders placed or performed during the hospital encounter of 05/10/21 (from the past 48 hour(s))  Comprehensive metabolic panel     Status: Abnormal   Collection Time: 05/10/21  5:34 PM  Result Value Ref Range    Sodium 136 135 - 145 mmol/L   Potassium 3.9 3.5 - 5.1 mmol/L   Chloride 106 98 - 111 mmol/L   CO2 24 22 - 32 mmol/L   Glucose, Bld 101 (H) 70 - 99 mg/dL    Comment: Glucose reference range applies only to samples taken after fasting for at least 8 hours.   BUN 7 6 - 20 mg/dL   Creatinine, Ser 1.610.60 0.44 - 1.00 mg/dL   Calcium 8.8 (L) 8.9 - 10.3 mg/dL   Total Protein 6.8  6.5 - 8.1 g/dL   Albumin 4.0 3.5 - 5.0 g/dL   AST 23 15 - 41 U/L   ALT 41 0 - 44 U/L   Alkaline Phosphatase 60 38 - 126 U/L   Total Bilirubin 1.1 0.3 - 1.2 mg/dL   GFR, Estimated >16 >96 mL/min    Comment: (NOTE) Calculated using the CKD-EPI Creatinine Equation (2021)    Anion gap 6 5 - 15    Comment: Performed at [facility], [address] [place], [place] [address]  CBC with Differential     Status: Abnormal   Collection Time: 05/10/21  5:34 PM  Result Value Ref Range   WBC 7.8 4.0 - 10.5 K/uL   RBC 4.33 3.87 - 5.11 MIL/uL   Hemoglobin 13.5 12.0 - 15.0 g/dL   HCT 10.1 75.1 - 02.5 %   MCV 92.4 80.0 - 100.0 fL   MCH 31.2 26.0 - 34.0 pg   MCHC 33.8 30.0 - 36.0 g/dL   RDW 85.2 77.8 - 24.2 %   Platelets 172 150 - 400 K/uL   nRBC 0.0 0.0 - 0.2 %   Neutrophils Relative % 90 %   Neutro Abs 7.1 1.7 - 7.7 K/uL   Lymphocytes Relative 5 %   Lymphs Abs 0.4 (L) 0.7 - 4.0 K/uL   Monocytes Relative 4 %   Monocytes Absolute 0.3 0.1 - 1.0 K/uL   Eosinophils Relative 0 %   Eosinophils Absolute 0.0 0.0 - 0.5 K/uL   Basophils Relative 0 %   Basophils Absolute 0.0 0.0 - 0.1 K/uL   Immature Granulocytes 1 %   Abs Immature Granulocytes 0.04 0.00 - 0.07 K/uL    Comment: Performed at [facility], [address], [place], [place] [address]  Urinalysis, Routine w reflex microscopic Urine, Clean Catch     Status: Abnormal   Collection Time: 05/10/21  5:34 PM  Result Value Ref Range   Color, Urine YELLOW YELLOW   APPearance HAZY (A) CLEAR   Specific Gravity, Urine 1.021 1.005 - 1.030   pH 7.0 5.0 - 8.0   Glucose, UA 50  (A) NEGATIVE mg/dL   Hgb urine dipstick SMALL (A) NEGATIVE   Bilirubin Urine NEGATIVE NEGATIVE   Ketones, ur NEGATIVE NEGATIVE mg/dL   Protein, ur NEGATIVE NEGATIVE mg/dL   Nitrite NEGATIVE NEGATIVE   Leukocytes,Ua NEGATIVE NEGATIVE   RBC / HPF 0-5 0 - 5 RBC/hpf   WBC, UA 0-5 0 - 5 WBC/hpf   Bacteria, UA NONE SEEN NONE SEEN   Squamous Epithelial / LPF 0-5 0 - 5   Mucus PRESENT     Comment: Performed at [facility], [address] [place], [place] [address]  Pregnancy, urine     Status: None   Collection Time: 05/10/21  5:34 PM  Result Value Ref Range   Preg Test, Ur NEGATIVE NEGATIVE    Comment:        THE SENSITIVITY OF THIS METHODOLOGY IS >20 mIU/mL. Performed at [facility], [address] [place], [place] [address]   Urine rapid drug screen (hosp performed)     Status: Abnormal   Collection Time: 05/10/21  5:34 PM  Result Value Ref Range   Opiates NONE DETECTED NONE DETECTED   Cocaine NONE DETECTED NONE DETECTED   Benzodiazepines NONE DETECTED NONE DETECTED   Amphetamines NONE DETECTED NONE DETECTED   Tetrahydrocannabinol POSITIVE (A) NONE DETECTED   Barbiturates NONE DETECTED NONE DETECTED    Comment: (NOTE) DRUG SCREEN FOR MEDICAL PURPOSES ONLY.  IF CONFIRMATION IS NEEDED FOR ANY PURPOSE, NOTIFY LAB WITHIN 5 DAYS.  LOWEST DETECTABLE LIMITS FOR URINE DRUG SCREEN Drug Class                     Cutoff (ng/mL) Amphetamine and metabolites    1000 Barbiturate and metabolites    200 Benzodiazepine                 200 Tricyclics and metabolites     300 Opiates and metabolites        300 Cocaine and metabolites        300 THC                            50 Performed at [facility], [address], [place], [place] [address]   Resp Panel by RT-PCR (Flu A&B, Covid) Nasopharyngeal Swab     Status: None   Collection Time: 05/10/21  6:42 PM   Specimen: Nasopharyngeal Swab; Nasopharyngeal(NP) swabs in vial transport medium  Result Value Ref Range   SARS Coronavirus 2 by  RT PCR NEGATIVE NEGATIVE    Comment: (NOTE) SARS-CoV-2 target nucleic acids are NOT DETECTED.  The SARS-CoV-2 RNA is generally detectable in upper respiratory specimens during the acute phase of infection. The lowest concentration of SARS-CoV-2 viral copies this assay can detect is 138 copies/mL. A negative result does not preclude SARS-Cov-2 infection and should not be used as the sole basis for treatment or other patient management decisions. A negative result may occur with  improper specimen collection/handling, submission of specimen other than nasopharyngeal swab, presence of viral mutation(s) within the areas targeted by this assay, and inadequate number of viral copies(<138 copies/mL). A negative result must be combined with clinical observations, patient history, and epidemiological information. The expected result is Negative.  Fact Sheet for Patients:  BloggerCourse.com  Fact Sheet for Healthcare Providers:  SeriousBroker.it  This test is no t yet approved or cleared by the [place] FDA and  has been authorized for detection and/or diagnosis of SARS-CoV-2 by FDA under an Emergency Use Authorization (EUA). This EUA will remain  in effect (meaning this test can be used) for the duration of the COVID-19 declaration under Section 564(b)(1) of the Act, 21 U.S.C.section 360bbb-3(b)(1), unless the authorization is terminated  or revoked sooner.       Influenza A by PCR NEGATIVE NEGATIVE   Influenza B by PCR NEGATIVE NEGATIVE    Comment: (NOTE) The Xpert Xpress SARS-CoV-2/FLU/RSV plus assay is intended as an aid in the diagnosis of influenza from Nasopharyngeal swab specimens and should not be used as a sole basis for treatment. Nasal washings and aspirates are unacceptable for Xpert Xpress SARS-CoV-2/FLU/RSV testing.  Fact Sheet for Patients: BloggerCourse.com  Fact Sheet for Healthcare  Providers: SeriousBroker.it  This test is not yet approved or cleared by the [place] FDA and has been authorized for detection and/or diagnosis of SARS-CoV-2 by FDA under an Emergency Use Authorization (EUA). This EUA will remain in effect (meaning this test can be used) for the duration of the COVID-19 declaration under Section 564(b)(1) of the Act, 21 U.S.C. section 360bbb-3(b)(1), unless the authorization is terminated or revoked.  Performed at [facility], [address], [place], [place] [address]     Chemistries  Recent Labs  Lab 05/10/21 1734  NA 136  K 3.9  CL 106  CO2 24  GLUCOSE 101*  BUN 7  CREATININE 0.60  CALCIUM 8.8*  AST 23  ALT 41  ALKPHOS 60  BILITOT 1.1   ------------------------------------------------------------------------------------------------------------------  ------------------------------------------------------------------------------------------------------------------ GFR: Estimated Creatinine Clearance: 94 mL/min (by C-G formula based on SCr of 0.6 mg/dL). Liver Function Tests: Recent Labs  Lab 05/10/21 1734  AST 23  ALT 41  ALKPHOS 60  BILITOT 1.1  PROT 6.8  ALBUMIN 4.0   No results for input(s): LIPASE, AMYLASE in the last 168 hours. No results for input(s): AMMONIA in the last 168 hours. Coagulation Profile: No results for input(s): INR, PROTIME in the last 168 hours. Cardiac Enzymes: No results for input(s): CKTOTAL, CKMB, CKMBINDEX, TROPONINI in the last 168 hours. BNP (last 3 results) No results for input(s): PROBNP in the last 8760 hours. HbA1C: No results for input(s): HGBA1C in the last 72 hours. CBG: No results for input(s): GLUCAP in the last 168 hours. Lipid Profile: No results for input(s): CHOL, HDL, LDLCALC, TRIG, CHOLHDL, LDLDIRECT in the last 72 hours. Thyroid Function Tests: No results for input(s): TSH, T4TOTAL, FREET4, T3FREE, THYROIDAB in the last 72 hours. Anemia  Panel: No results for input(s): VITAMINB12, FOLATE, FERRITIN, TIBC, IRON, RETICCTPCT in the last 72 hours.  --------------------------------------------------------------------------------------------------------------- Urine analysis:    Component Value Date/Time   COLORURINE YELLOW 05/10/2021 1734   APPEARANCEUR HAZY (A) 05/10/2021 1734   LABSPEC 1.021 05/10/2021 1734   PHURINE 7.0 05/10/2021 1734   GLUCOSEU 50 (A) 05/10/2021 1734   HGBUR SMALL (A) 05/10/2021 1734   BILIRUBINUR NEGATIVE 05/10/2021 1734   KETONESUR NEGATIVE 05/10/2021 1734   PROTEINUR NEGATIVE 05/10/2021 1734   NITRITE NEGATIVE 05/10/2021 1734   LEUKOCYTESUR NEGATIVE 05/10/2021 1734      Imaging Results:    CT Head Wo Contrast  Result Date: 05/10/2021 CLINICAL DATA:  Slurred speech, history of multiple sclerosis EXAM: CT HEAD WITHOUT CONTRAST TECHNIQUE: Contiguous axial images were obtained from the base of the skull through the vertex without intravenous contrast. COMPARISON:  05/05/2021 FINDINGS: Brain: No evidence of acute infarction, hemorrhage, hydrocephalus, extra-axial collection or mass lesion/mass effect. Chronic white matter ischemic change is noted. Mild atrophic changes are noted as well. Vascular: No hyperdense vessel or unexpected calcification. Skull: Normal. Negative for fracture or focal lesion. Sinuses/Orbits: No acute finding. Other: None. IMPRESSION: Chronic atrophic and ischemic changes without acute abnormality. Electronically Signed   By: Alcide Clever M.D.   On: 05/10/2021 18:08       Assessment & Plan:    Active Problems:   Multiple sclerosis (HCC)   1. Multiple sclerosis flare 1. Multiple lesions noted on MRI 2 weeks ago 2. Neurology recommends admission, neurology consult in the a.m., high-dose steroids 3. 1 g Solu-Medrol given in the ED 4. We will continue 1 g Solu-Medrol daily for 3 days 5. Neurochecks 6. CT head without acute abnormality 7. Continue to monitor 2. Acute  metabolic encephalopathy 1. Patient is somnolent-reported intermittently somnolent in the ED 2. UDS shows THC 3. Check TSH in the a.m. 4. Check aspirin, salicylate, ammonia level 5. Check VBG 6. CT head without acute abnormality 7. No signs of infection 8. Neurochecks as above 9. Continue to monitor 3. Chronic pain 1. Continue home medications if patient is awake enough to take them 4. Substance abuse 1. Will need counseling on importance of cessation of substance abuse when patient is alert 5.    DVT Prophylaxis-   Heparin- SCDs  AM Labs Ordered, also please review Full Orders  Family Communication: No family at bedside  Code Status: Full  Admission status: Observation  Time spent in minutes : 65   Dela Sweeny B Zierle-Ghosh DO

## 2021-05-10 NOTE — ED Notes (Addendum)
Pt very somnolent and unable to keep eyes open. Pt refusing to keep heart monitor/pulse ox and bp cuff on. Pt provided meal tray. Informed of visitation policy

## 2021-05-10 NOTE — ED Provider Notes (Signed)
[facility] Provider Note   CSN: [id number] Arrival date & time: 05/10/21  1327     History Chief Complaint  Patient presents with  . Fatigue    [facility] is a 45 y.o. female.  HPI   Patient with significant medical history of MS, seizures presents to the emergency department with an MS flareup.  Patient states she moved from [place] down here approximately 3 weeks ago and is currently homeless.  Patient states she is having a flareup at this time, she states that her gait is very abnormal and her entire body hurts.  She denies headaches, change in vision, paresthesias or weakness in the upper or lower extremities.  She denies recent falls where she has hit her head or loss conscious, she is not on anticoagulant.  Patient denies systemic infection like fevers or chills, she denies IV drug use, she does endorse that she smokes marijuana but denies other illicit drugs.  Patient denies  alleviating factors.  She denies headaches, fevers, chills, shortness of breath, chest pain, abdominal pain, nausea, vomiting, diarrhea, pedal edema.    After reviewing patient's chart she is using seen at [facility] on the 16th where she had MRI of her brain and spine which showed lytic changes consistent with MS.  Neurology was consulted at time and recommend high-dose of steroids and follow-up at outpatient neurology.  Past Medical History:  Diagnosis Date  . Multiple sclerosis (HCC)   . Seizures [facility])     Patient Active Problem List   Diagnosis Date Noted  . Multiple sclerosis (HCC) 05/10/2021    History reviewed. No pertinent surgical history.   OB History   No obstetric history on file.     No family history on file.  Social History   Tobacco Use  . Smoking status: Current Every Day Smoker  . Smokeless tobacco: Never Used  Substance Use Topics  . Alcohol use: Not Currently  . Drug use: Not Currently    Home Medications Prior to Admission  medications   Medication Sig Start Date End Date Taking? Authorizing Provider  GILENYA 0.5 MG CAPS Take 1 capsule by mouth daily. 04/01/21  Yes [provider]  HYDROcodone-acetaminophen (NORCO) 7.5-325 MG tablet Take 1 tablet by mouth 2 (two) times daily as needed for moderate pain. 04/16/21  Yes [provider]  pregabalin (LYRICA) 100 MG capsule Take 100 mg by mouth every 8 (eight) hours as needed (pain). 03/29/21  Yes [provider]  traMADol (ULTRAM) 50 MG tablet Take 50 mg by mouth every 8 (eight) hours as needed for moderate pain. 12/15/20  Yes [provider]    Allergies    Naproxen and Neurontin [gabapentin]  Review of Systems   Review of Systems  Constitutional: Negative for chills and fever.  HENT: Negative for congestion and sore throat.   Eyes: Negative for visual disturbance.  Respiratory: Negative for shortness of breath.   Cardiovascular: Negative for chest pain.  Gastrointestinal: Negative for abdominal pain.  Genitourinary: Negative for enuresis.  Musculoskeletal: Positive for myalgias. Negative for back pain.  Skin: Negative for rash.  Neurological: Negative for dizziness.       Abnormal gait  Hematological: Does not bruise/bleed easily.    Physical Exam Updated Vital Signs BP 107/71   Pulse (!) 57   Temp 98.5 F (36.9 C) (Oral)   Resp 18   Ht 5\' 5"  (1.651 m)   Wt 77.1 kg   SpO2 100%   BMI  28.29 kg/m   Physical Exam Vitals and nursing note reviewed.  Constitutional:      General: She is not in acute distress.    Appearance: She is not ill-appearing.     Comments: Patient appears to be somnolent on my exam  HENT:     Head: Normocephalic and atraumatic.     Nose: No congestion.  Eyes:     General: No visual field deficit.       Right eye: No discharge.        Left eye: No discharge.     Extraocular Movements: Extraocular movements intact.     Conjunctiva/sclera: Conjunctivae normal.     Pupils: Pupils are equal,  round, and reactive to light.  Cardiovascular:     Rate and Rhythm: Normal rate and regular rhythm.     Pulses: Normal pulses.     Heart sounds: No murmur heard. No friction rub. No gallop.   Pulmonary:     Effort: No respiratory distress.     Breath sounds: No wheezing, rhonchi or rales.  Abdominal:     Palpations: Abdomen is soft.     Tenderness: There is no abdominal tenderness.  Musculoskeletal:     Cervical back: No rigidity.     Right lower leg: No edema.     Left lower leg: No edema.     Comments: Patient has 5 out of 5 strength, neurovascular tact in the upper and lower extremities.    Skin:    General: Skin is warm and dry.     Comments: Patient has multiple bruises on her upper lower extremities, she endorses that these are from failed attempts at IVs.  Neurological:     Mental Status: She is alert.     Cranial Nerves: Facial asymmetry present.     Sensory: Sensation is intact.     Motor: No weakness.     Coordination: Romberg sign negative. Finger-Nose-Finger Test normal.     Gait: Gait abnormal.     Comments: Current nerves II through XII grossly intact  It is noted the patient has a left droopy eye patient states this is normal for her.  Patient has no difficulty with word finding.  Patient has a shuffled gait and appears to be very off balance  Psychiatric:        Mood and Affect: Mood normal.     ED Results / Procedures / Treatments   Labs (all labs ordered are listed, but only abnormal results are displayed) Labs Reviewed  COMPREHENSIVE METABOLIC PANEL - Abnormal; Notable for the following components:      Result Value   Glucose, Bld 101 (*)    Calcium 8.8 (*)    All other components within normal limits  CBC WITH DIFFERENTIAL/PLATELET - Abnormal; Notable for the following components:   Lymphs Abs 0.4 (*)    All other components within normal limits  URINALYSIS, ROUTINE W REFLEX MICROSCOPIC - Abnormal; Notable for the following components:    APPearance HAZY (*)    Glucose, UA 50 (*)    Hgb urine dipstick SMALL (*)    All other components within normal limits  RAPID URINE DRUG SCREEN, HOSP PERFORMED - Abnormal; Notable for the following components:   Tetrahydrocannabinol POSITIVE (*)    All other components within normal limits  RESP PANEL BY RT-PCR (FLU A&B, COVID) ARPGX2  PREGNANCY, URINE  BLOOD GAS, VENOUS  ACETAMINOPHEN LEVEL  SALICYLATE LEVEL  AMMONIA    EKG EKG Interpretation  Date/Time:  Monday May 10 2021 17:54:25 EDT Ventricular Rate:  67 PR Interval:  146 QRS Duration: 76 QT Interval:  410 QTC Calculation: 433 R Axis:   19 Text Interpretation: Sinus rhythm Low voltage, precordial leads No old tracing to compare Confirmed by Meridee Score [id number]) on 05/10/2021 6:22:37 PM   Radiology CT Head Wo Contrast  Result Date: 05/10/2021 CLINICAL DATA:  Slurred speech, history of multiple sclerosis EXAM: CT HEAD WITHOUT CONTRAST TECHNIQUE: Contiguous axial images were obtained from the base of the skull through the vertex without intravenous contrast. COMPARISON:  05/05/2021 FINDINGS: Brain: No evidence of acute infarction, hemorrhage, hydrocephalus, extra-axial collection or mass lesion/mass effect. Chronic white matter ischemic change is noted. Mild atrophic changes are noted as well. Vascular: No hyperdense vessel or unexpected calcification. Skull: Normal. Negative for fracture or focal lesion. Sinuses/Orbits: No acute finding. Other: None. IMPRESSION: Chronic atrophic and ischemic changes without acute abnormality. Electronically Signed   By: Alcide Clever M.D.   On: 05/10/2021 18:08    Procedures Procedures   Medications Ordered in ED Medications  morphine 4 MG/ML injection 4 mg (4 mg Intravenous Not Given 05/10/21 1943)  methylPREDNISolone sodium succinate (SOLU-MEDROL) 1,000 mg in sodium chloride 0.9 % 50 mL IVPB (has no administration in time range)  acetaminophen (TYLENOL) tablet 650 mg (has no  administration in time range)    ED Course  I have reviewed the triage vital signs and the nursing notes.  Pertinent labs & imaging results that were available during my care of the patient were reviewed by me and considered in my medical decision making (see chart for details).    MDM Rules/Calculators/A&P                         Initial impression-patient presents with a MS flareup.  She is alert, does not appear in acute distress, slightly somnolent on my exam.  Vital signs reassuring.  Concern for worsening MS, will obtain basic lab work-up, CT head, consult with neurology for further recommendations.  Work-up-CBC unremarkable, CMP shows slight hyperglycemia 101, urine pregnancy negative, urine drug screen positive for cannabinoids, UA negative for signs infection or hematuria, respiratory panel negative.  CT head negative for acute findings.  EKG sinus without signs of ischemia  Reassessment-patient was updated on recommendations from neurologist she is agreeable to this plan.  She does tell me at this time that she is in severe back pain and would like something for pain.  Will provide patient with narcotics.  Nursing staff notified me that patient was somnolent and withhold morphine I find this acceptable.  will continue to monitor.  Suspect secondary due to cannabinoid use.  Consult-spoke with Dr. Jerrell Belfast of neurology he recommends admission for high-dose steroids 1 g Solu-Medrol daily for next 3 days.  He also like a formal consultation by onsite neurologist.  Please see his note for detail.  he does not recommend further imaging at this time since she had imaging 2 weeks ago.  Rule out- low suspicion for systemic infection as patient nontoxic-appearing, vital signs reassuring, no leukocytosis seen on CBC, I doubt meningitis as there is no meningeal sign present.  Low suspicion for CVA or intracranial head bleed as there is no acute findings seen on CT scan.  Patient does have noted  abnormal gait but I suspect this is secondary due to MS flareup as this is documented in MRI 2 weeks ago.  Patient was noted to be slightly somnolent on my  exam most likely secondary due to cannabinoid use.  Plan-admit to medicine due to MS flareup, patient will be turned over to hospitalist team.  Final Clinical Impression(s) / ED Diagnoses Final diagnoses:  Abnormal gait    Rx / DC Orders ED Discharge Orders    None       Carroll Sage, PA-C 05/10/21 2059    Terrilee Files, MD 05/18/21 1400

## 2021-05-10 NOTE — ED Notes (Signed)
Called [facility] for solumedrol

## 2021-05-10 NOTE — Progress Notes (Signed)
On-call neurologist phone note Received a call from ED APP at [facility] presenting with MS flareup like symptoms.  Patient recently seen for an MS flareup.  MRI of the brain and C-spine shows enhancing lesions. She was recommended 3 days of high-dose IV steroids-I think she got 1 in the ER and was sent home with p.o. steroids-unclear if she has taken it or not. I would recommend that this patient stay in the hospital, get 3 days of 1 g IV Solu-Medrol daily. Depending on her response, that she might need 5 days of IV Solu-Medrol. If her symptoms worsen or do not improve, she might be a candidate for plasma exchange as well. [place] is moved here from [place], is currently homeless and has no neurological care. I spoke with the ED APP and recommended admission to the hospital with follow-up with Dr. Gerilyn Pilgrim in the morning as an inpatient. She has been seen couple of other times in the last 2 weeks for other issues such as back pain etc. and I am not sure if she has completed a full course of high-dose steroids. If she would rather come to [facility], I have requested that they let me know so that I can inform my team at [facility] that this patient might be coming for MS flareup treatment. The APP verbalized understanding.   -- Milon Dikes, MD Neurologist [facility] Pager: [phone]

## 2021-05-10 NOTE — ED Triage Notes (Signed)
States she has MS and is having a relapse, sent from urgent care for evaluation , states he walking is off.

## 2021-05-10 NOTE — ED Notes (Signed)
Pt removed iv and is wanting to leave. EDP made aware. Pt refusing to sign AMA. EDP at bedside trying to talk pt into staying.

## 2021-05-10 NOTE — ED Notes (Signed)
Pt left ama without signing. Pt previously discussed with provider the risks of leaving.

## 2021-06-01 ENCOUNTER — Ambulatory Visit: Payer: 59 | Admitting: Neurology

## 2021-07-06 ENCOUNTER — Emergency Department (HOSPITAL_COMMUNITY): Payer: 59

## 2021-07-06 ENCOUNTER — Encounter (HOSPITAL_COMMUNITY): Payer: Self-pay

## 2021-07-06 ENCOUNTER — Observation Stay (HOSPITAL_COMMUNITY)
Admission: EM | Admit: 2021-07-06 | Discharge: 2021-07-07 | Disposition: A | Discharge status: Home / Self Care | Payer: 59 | Attending: Internal Medicine | Admitting: Internal Medicine

## 2021-07-06 ENCOUNTER — Other Ambulatory Visit: Payer: Self-pay

## 2021-07-06 DIAGNOSIS — R419 Unspecified symptoms and signs involving cognitive functions and awareness: Secondary | ICD-10-CM | POA: Diagnosis present

## 2021-07-06 DIAGNOSIS — R4182 Altered mental status, unspecified: Secondary | ICD-10-CM | POA: Diagnosis present

## 2021-07-06 DIAGNOSIS — R2689 Other abnormalities of gait and mobility: Secondary | ICD-10-CM | POA: Diagnosis not present

## 2021-07-06 DIAGNOSIS — F1920 Other psychoactive substance dependence, uncomplicated: Secondary | ICD-10-CM | POA: Diagnosis not present

## 2021-07-06 DIAGNOSIS — G35 Multiple sclerosis: Secondary | ICD-10-CM | POA: Diagnosis not present

## 2021-07-06 DIAGNOSIS — F17200 Nicotine dependence, unspecified, uncomplicated: Secondary | ICD-10-CM | POA: Insufficient documentation

## 2021-07-06 DIAGNOSIS — Z20822 Contact with and (suspected) exposure to covid-19: Secondary | ICD-10-CM | POA: Diagnosis not present

## 2021-07-06 DIAGNOSIS — R569 Unspecified convulsions: Secondary | ICD-10-CM | POA: Diagnosis not present

## 2021-07-06 DIAGNOSIS — Y900 Blood alcohol level of less than 20 mg/100 ml: Secondary | ICD-10-CM | POA: Diagnosis not present

## 2021-07-06 DIAGNOSIS — G9340 Encephalopathy, unspecified: Secondary | ICD-10-CM | POA: Diagnosis not present

## 2021-07-06 LAB — GLUCOSE, CAPILLARY: Glucose-Capillary: 115 mg/dL — ABNORMAL HIGH (ref 70–99)

## 2021-07-06 LAB — CBC WITH DIFFERENTIAL/PLATELET
Abs Immature Granulocytes: 0.04 10*3/uL (ref 0.00–0.07)
Basophils Absolute: 0 10*3/uL (ref 0.0–0.1)
Basophils Relative: 1 %
Eosinophils Absolute: 0.2 10*3/uL (ref 0.0–0.5)
Eosinophils Relative: 3 %
HCT: 38.7 % (ref 36.0–46.0)
Hemoglobin: 12.6 g/dL (ref 12.0–15.0)
Immature Granulocytes: 1 %
Lymphocytes Relative: 8 %
Lymphs Abs: 0.5 10*3/uL — ABNORMAL LOW (ref 0.7–4.0)
MCH: 31.2 pg (ref 26.0–34.0)
MCHC: 32.6 g/dL (ref 30.0–36.0)
MCV: 95.8 fL (ref 80.0–100.0)
Monocytes Absolute: 0.6 10*3/uL (ref 0.1–1.0)
Monocytes Relative: 10 %
Neutro Abs: 4.6 10*3/uL (ref 1.7–7.7)
Neutrophils Relative %: 77 %
Platelets: 296 10*3/uL (ref 150–400)
RBC: 4.04 MIL/uL (ref 3.87–5.11)
RDW: 13.8 % (ref 11.5–15.5)
WBC: 5.9 10*3/uL (ref 4.0–10.5)
nRBC: 0 % (ref 0.0–0.2)

## 2021-07-06 LAB — BLOOD GAS, VENOUS
Acid-base deficit: 2.1 mmol/L — ABNORMAL HIGH (ref 0.0–2.0)
Bicarbonate: 22.5 mmol/L (ref 20.0–28.0)
FIO2: 100
O2 Saturation: 87.7 %
Patient temperature: 36.9
pCO2, Ven: 38.5 mmHg — ABNORMAL LOW (ref 44.0–60.0)
pH, Ven: 7.379 (ref 7.250–7.430)
pO2, Ven: 55.8 mmHg — ABNORMAL HIGH (ref 32.0–45.0)

## 2021-07-06 LAB — COMPREHENSIVE METABOLIC PANEL
ALT: 26 U/L (ref 0–44)
AST: 19 U/L (ref 15–41)
Albumin: 3.7 g/dL (ref 3.5–5.0)
Alkaline Phosphatase: 52 U/L (ref 38–126)
Anion gap: 5 (ref 5–15)
BUN: 14 mg/dL (ref 6–20)
CO2: 26 mmol/L (ref 22–32)
Calcium: 8.5 mg/dL — ABNORMAL LOW (ref 8.9–10.3)
Chloride: 106 mmol/L (ref 98–111)
Creatinine, Ser: 0.74 mg/dL (ref 0.44–1.00)
GFR, Estimated: >60 mL/min (ref >60)
Glucose, Bld: 109 mg/dL — ABNORMAL HIGH (ref 70–99)
Potassium: 3.6 mmol/L (ref 3.5–5.1)
Sodium: 137 mmol/L (ref 135–145)
Total Bilirubin: 0.9 mg/dL (ref 0.3–1.2)
Total Protein: 4.1 g/dL — ABNORMAL LOW (ref 6.5–8.1)

## 2021-07-06 LAB — SALICYLATE LEVEL: Salicylate Lvl: <7 mg/dL — ABNORMAL LOW (ref 7.0–30.0)

## 2021-07-06 LAB — RESP PANEL BY RT-PCR (FLU A&B, COVID) ARPGX2
Influenza A by PCR: NEGATIVE
Influenza B by PCR: NEGATIVE
SARS Coronavirus 2 by RT PCR: NEGATIVE

## 2021-07-06 LAB — URINALYSIS, ROUTINE W REFLEX MICROSCOPIC
Bilirubin Urine: NEGATIVE
Glucose, UA: NEGATIVE mg/dL
Hgb urine dipstick: NEGATIVE
Ketones, ur: NEGATIVE mg/dL
Leukocytes,Ua: NEGATIVE
Nitrite: NEGATIVE
Protein, ur: NEGATIVE mg/dL
Specific Gravity, Urine: 1.019 (ref 1.005–1.030)
pH: 6 (ref 5.0–8.0)

## 2021-07-06 LAB — HCG, SERUM, QUALITATIVE: Preg, Serum: NEGATIVE

## 2021-07-06 LAB — RAPID URINE DRUG SCREEN, HOSP PERFORMED
Amphetamines: NOT DETECTED
Barbiturates: NOT DETECTED
Benzodiazepines: NOT DETECTED
Cocaine: NOT DETECTED
Opiates: NOT DETECTED
Tetrahydrocannabinol: POSITIVE — AB

## 2021-07-06 LAB — TSH: TSH: 1.071 u[IU]/mL (ref 0.350–4.500)

## 2021-07-06 LAB — AMMONIA: Ammonia: 29 umol/L (ref 9–35)

## 2021-07-06 LAB — HIV ANTIBODY (ROUTINE TESTING W REFLEX): HIV Screen 4th Generation wRfx: NONREACTIVE

## 2021-07-06 LAB — MAGNESIUM: Magnesium: 1.9 mg/dL (ref 1.7–2.4)

## 2021-07-06 LAB — ACETAMINOPHEN LEVEL: Acetaminophen (Tylenol), Serum: <10 ug/mL — ABNORMAL LOW (ref 10–30)

## 2021-07-06 LAB — VITAMIN B12: Vitamin B-12: 148 pg/mL — ABNORMAL LOW (ref 180–914)

## 2021-07-06 LAB — PHOSPHORUS: Phosphorus: 4.1 mg/dL (ref 2.5–4.6)

## 2021-07-06 LAB — ETHANOL: Alcohol, Ethyl (B): <10 mg/dL (ref <10)

## 2021-07-06 IMAGING — CT CT CERVICAL SPINE W/O CM
3 of 4 series · 13 of 35 positions shown, 16 images · non-contrast
Comparison: Prior study [DATE].

CLINICAL DATA: Fell.

EXAM:
CT CERVICAL SPINE WITHOUT CONTRAST
TECHNIQUE: Multidetector CT imaging of the cervical spine was performed without
intravenous contrast. Multiplanar CT image reconstructions were also
generated.

[Series 5: sag bone · sagittal · 0.26mm/px · 5 of 61 slices shown, 6 images]
[im 21/61  bone]
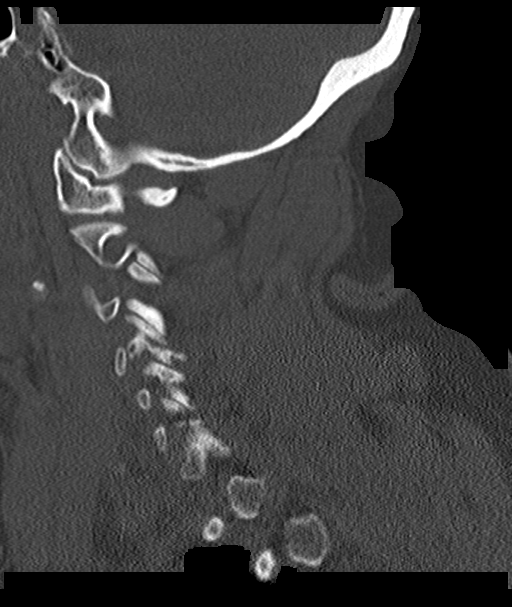
[im 26/61  bone]
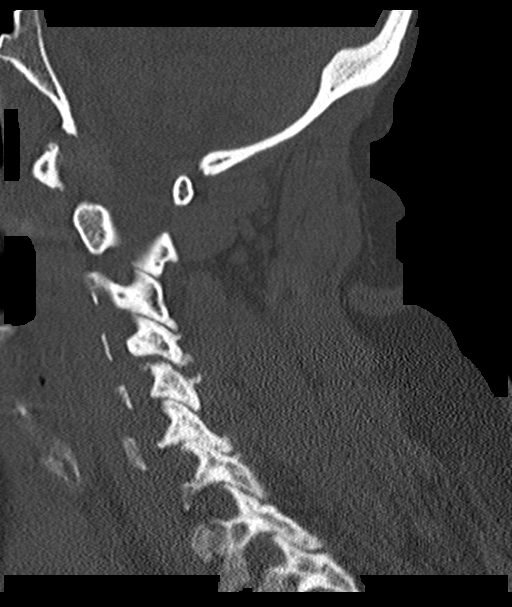
[im 31/61  soft-tissue]
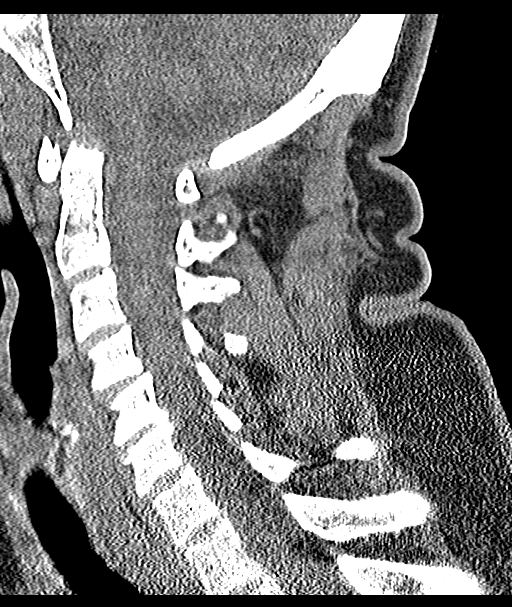
[im 31/61  bone]
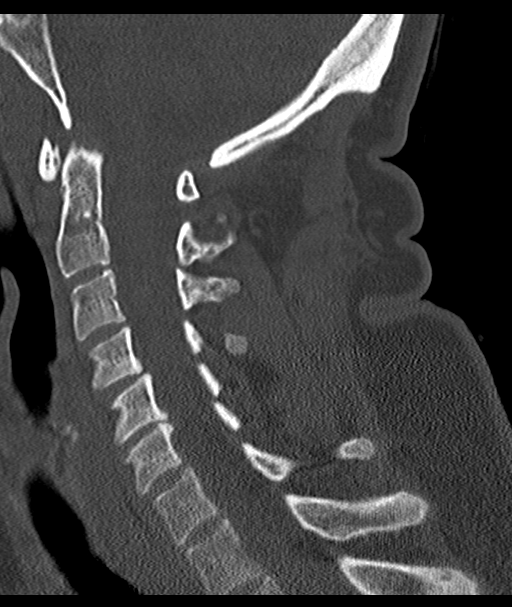
[im 36/61  bone]
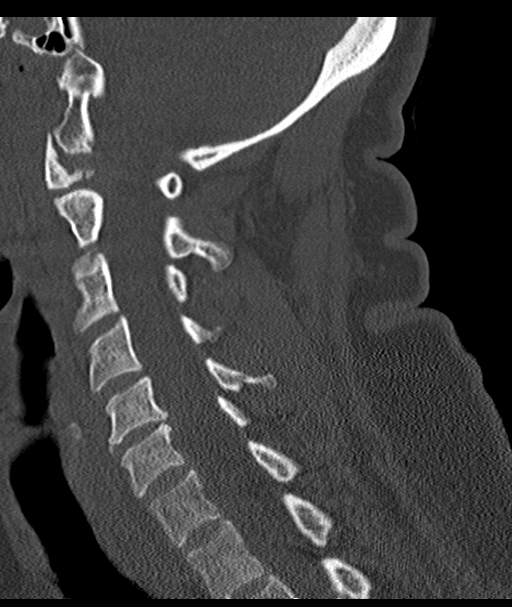
[im 41/61  bone]
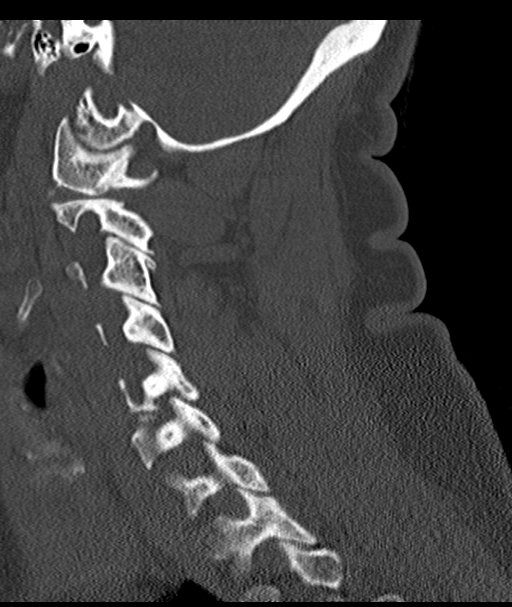

[Series 6: cor bone · coronal · 0.19mm/px · 3 of 61 slices shown]
[im 13/61  bone]
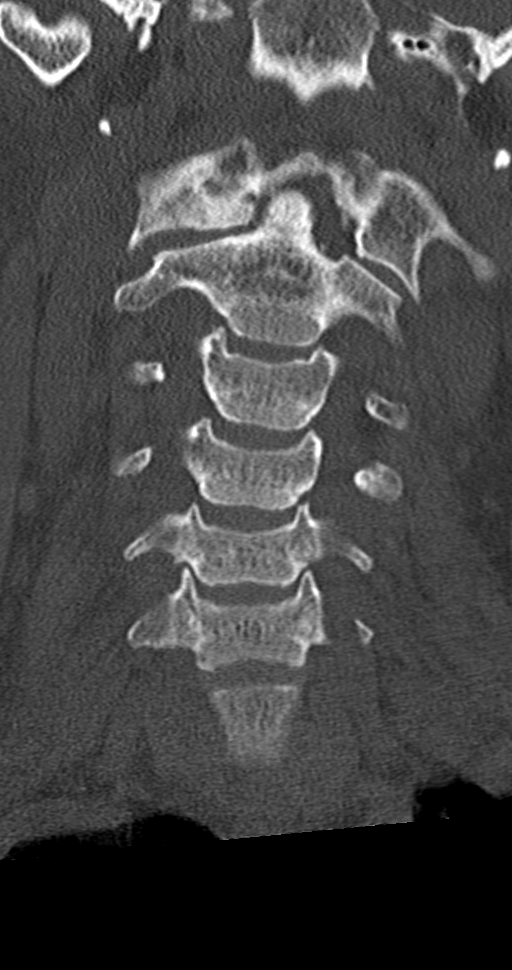
[im 25/61  bone]
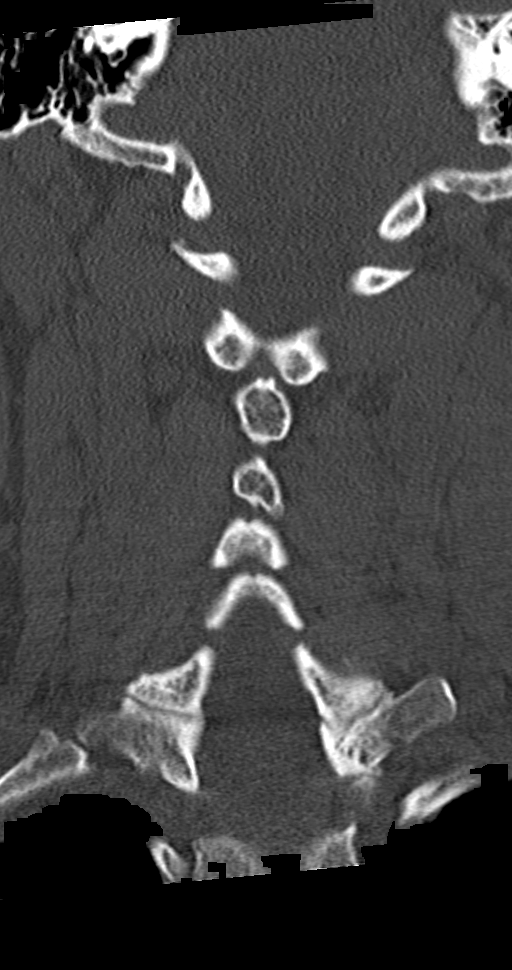
[im 37/61  bone]
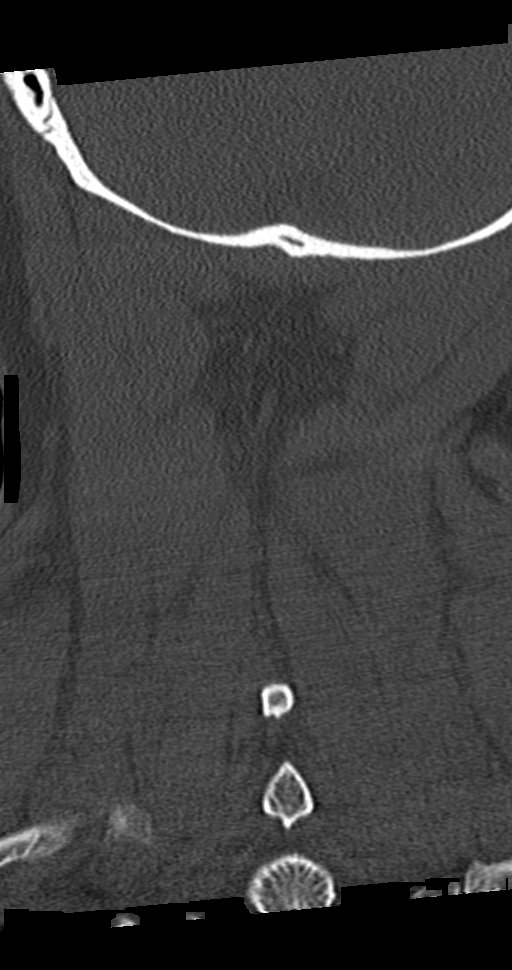

[Series 7: orthogonal axials · axial · 0.21mm/px · z∈[-91,+14]mm · 5 of 77 slices shown, 7 images]
[im 13/77  soft-tissue]
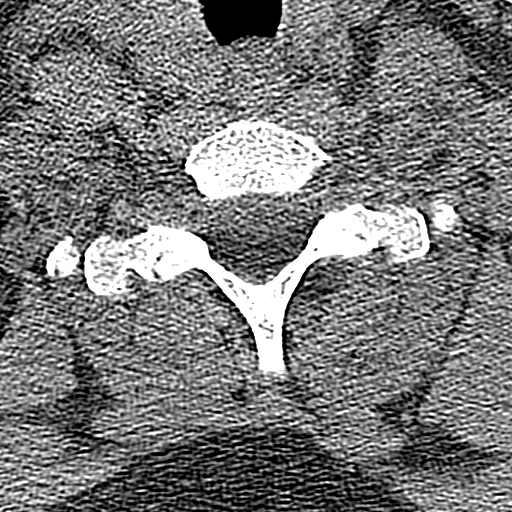
[im 13/77  bone]
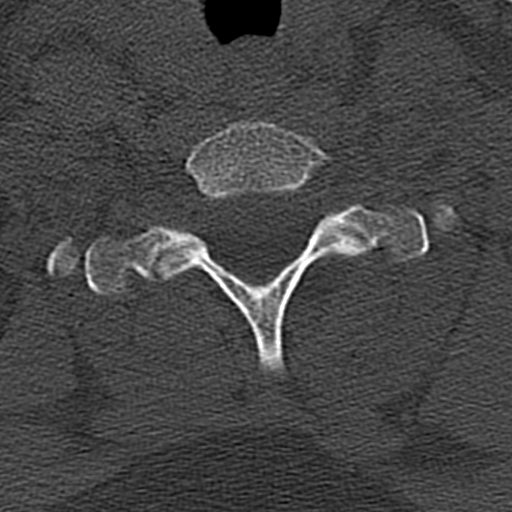
[im 26/77  bone]
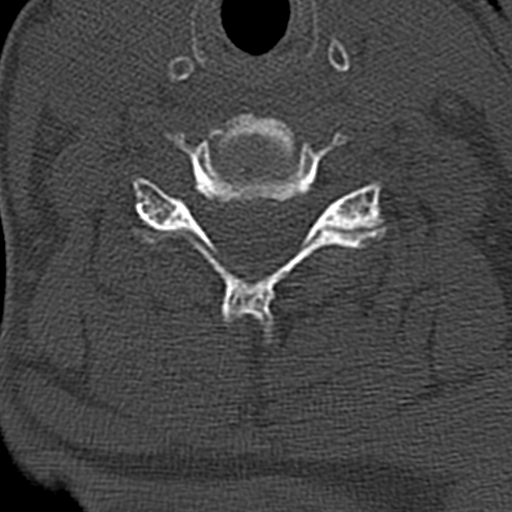
[im 39/77  bone]
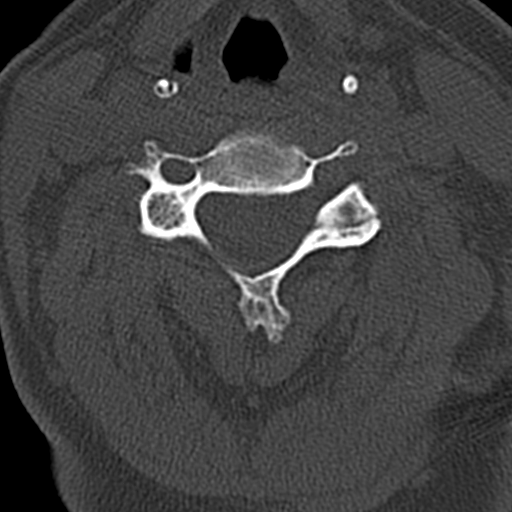
[im 51/77  bone]
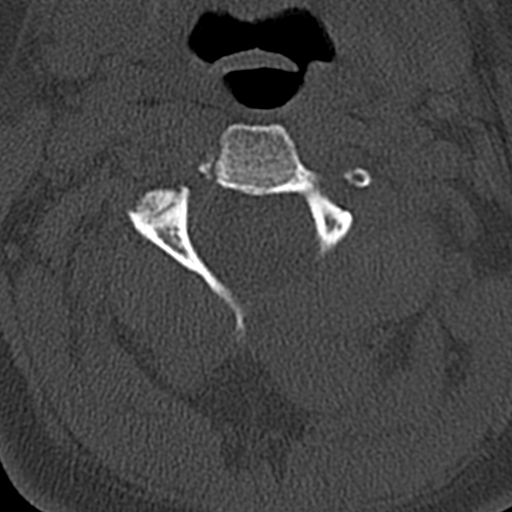
[im 64/77  soft-tissue]
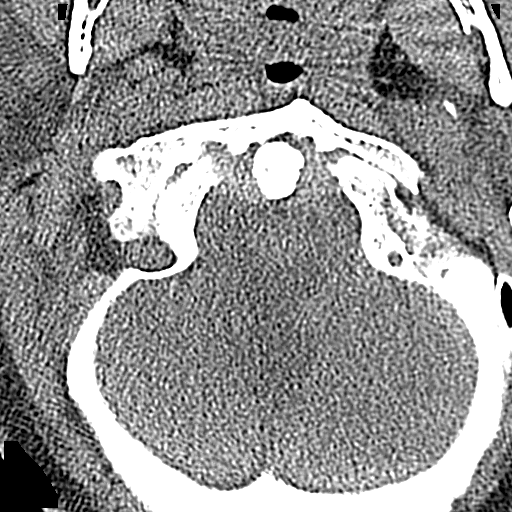
[im 64/77  bone]
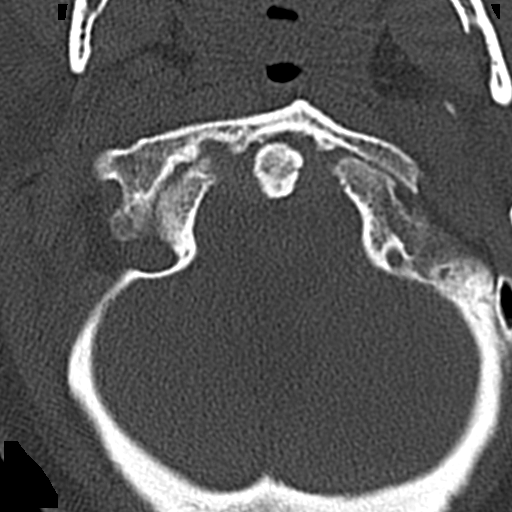

[13 of 35 positions shown; findings below may reference images not displayed]

FINDINGS: Alignment: Normal overall alignment of the cervical vertebral
bodies.

Skull base and vertebrae: Remote appearing fractures of C1 are again
demonstrated. The ununited fractures have smooth and corticated
margins and there is areas of sclerosis suggesting a remote injury.
No change since the prior recent examination. This is not obvious on
the prior cervical spine MRI from [[facility]]. The skull base C1 and C1-2
articulations are maintained. There are moderate degenerative
changes at C1-2.

No acute cervical spine fractures are identified.

Soft tissues and spinal canal: No prevertebral fluid or swelling. No
visible canal hematoma.

Disc levels: The spinal canal is generous. There is multilevel facet
disease but no significant spinal or foraminal stenosis.

Upper chest: The lung apices are grossly clear.

Other: No neck mass or adenopathy.
IMPRESSION: 1. Remote appearing fractures of C1. No change since the prior
recent examination.
2. No acute cervical spine fractures.
3. Degenerative cervical spondylosis with multilevel facet disease.

## 2021-07-06 IMAGING — CT CT HEAD W/O CM
3 series · 15 of 47 positions shown, 18 images · non-contrast
Comparison: Multiple prior head CTs. The most recent is [DATE]

CLINICAL DATA: Mental status change. Fell. History of MS.

EXAM:
CT HEAD WITHOUT CONTRAST
TECHNIQUE: Contiguous axial images were obtained from the base of the skull
through the vertex without intravenous contrast.

[Series 2: head w o · axial · 0.41mm/px · z∈[+57,+182]mm · 9 of 31 slices shown, 12 images]
[im 3/31  brain]
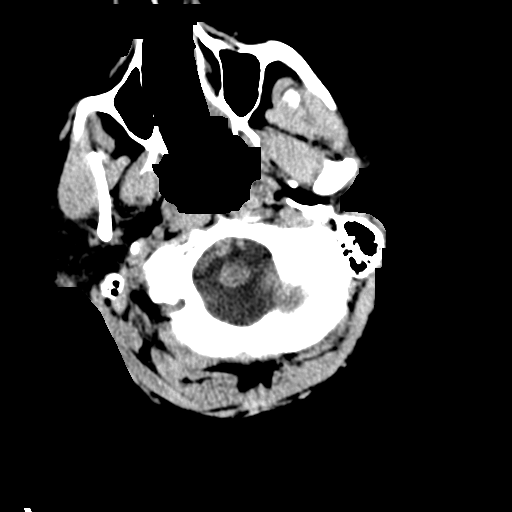
[im 3/31  bone]
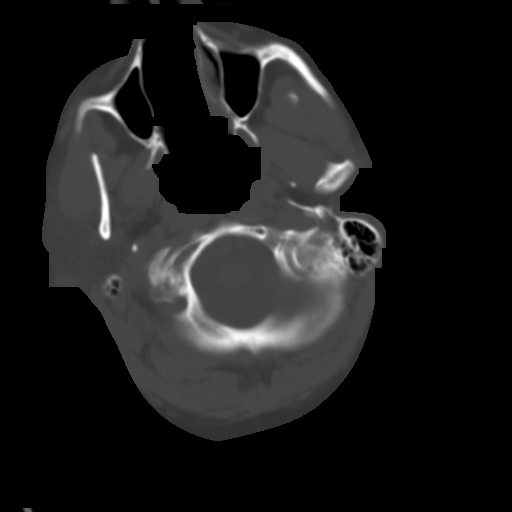
[im 6/31  brain]
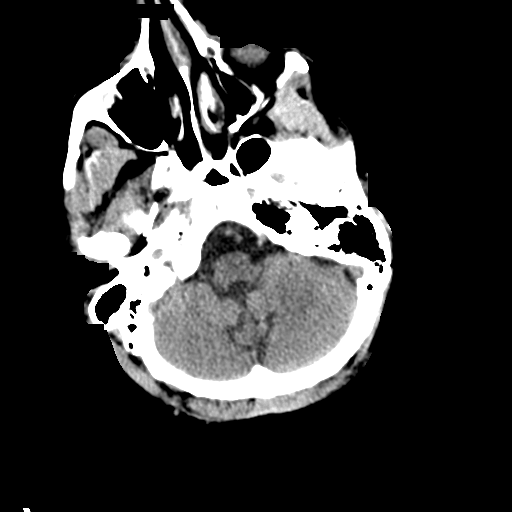
[im 9/31  brain]
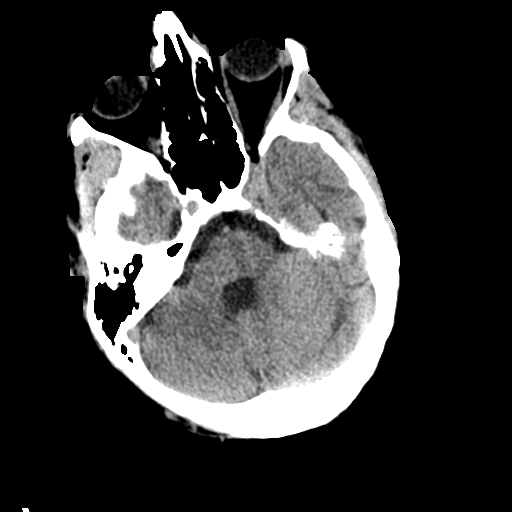
[im 12/31  brain]
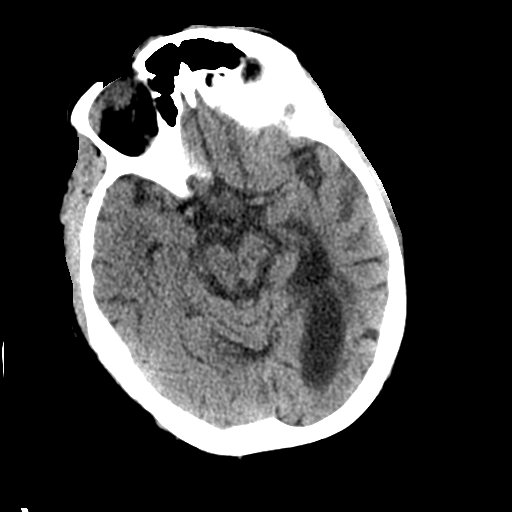
[im 16/31  brain]
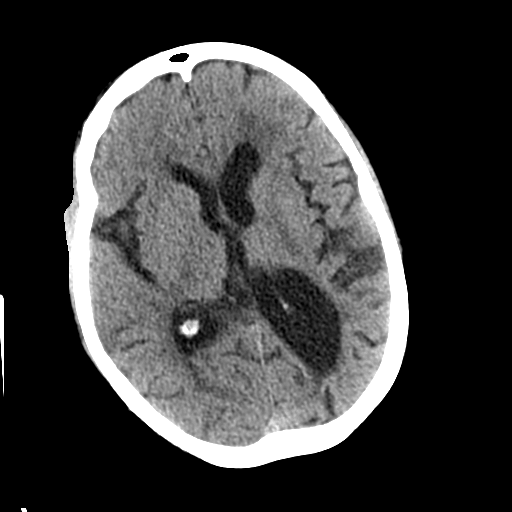
[im 16/31  bone]
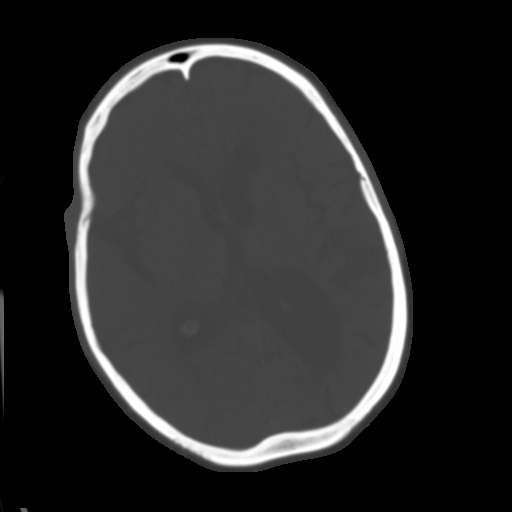
[im 19/31  brain]
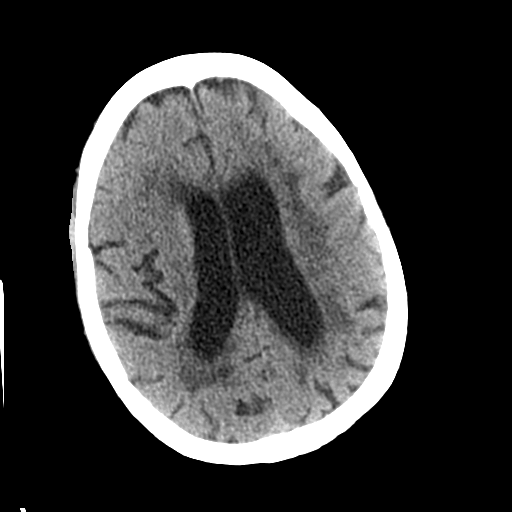
[im 22/31  brain]
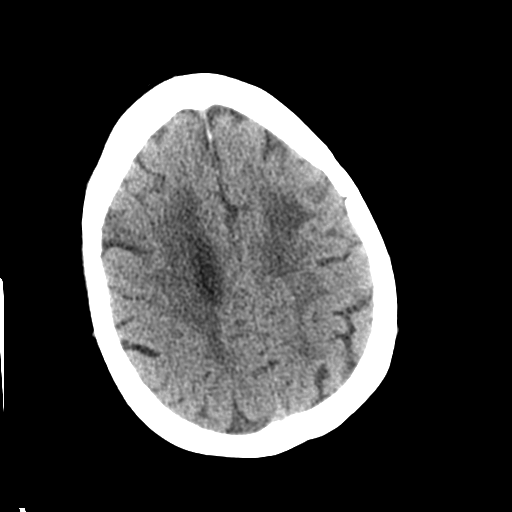
[im 25/31  brain]
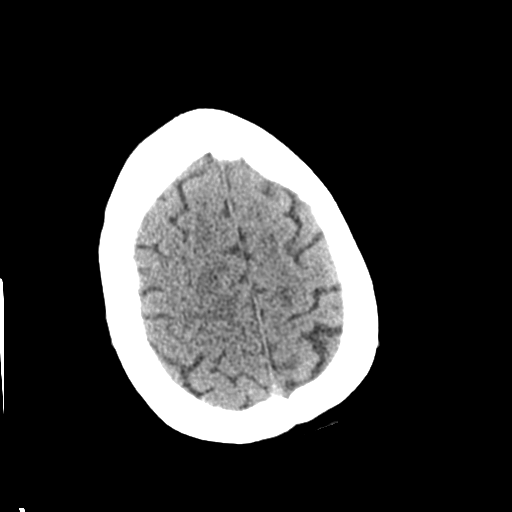
[im 28/31  brain]
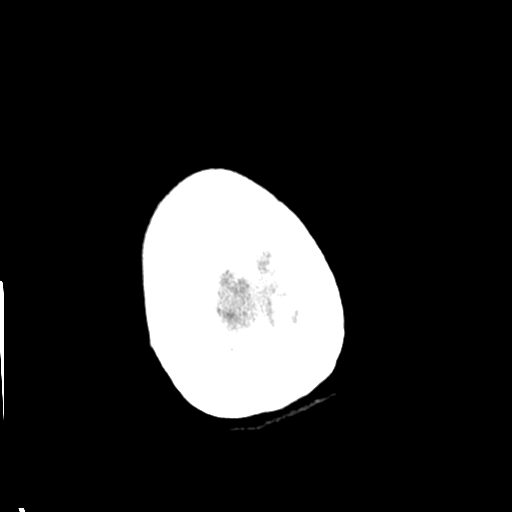
[im 28/31  bone]
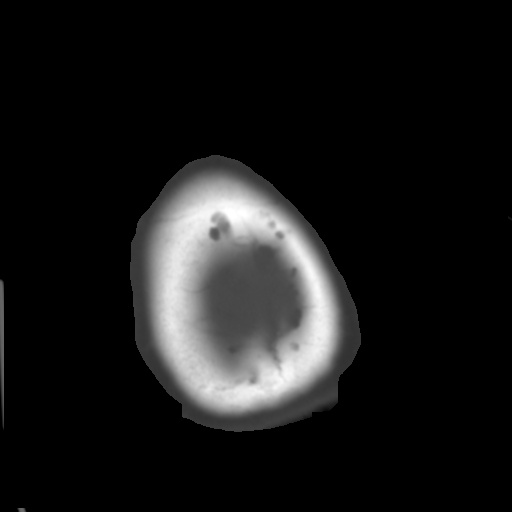

[Series 4: coronal soft · coronal · 0.33mm/px · 3 of 66 slices shown]
[im 22/66  brain]
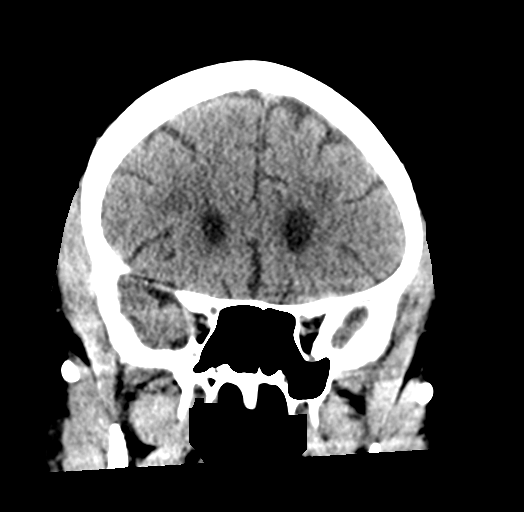
[im 29/66  brain]
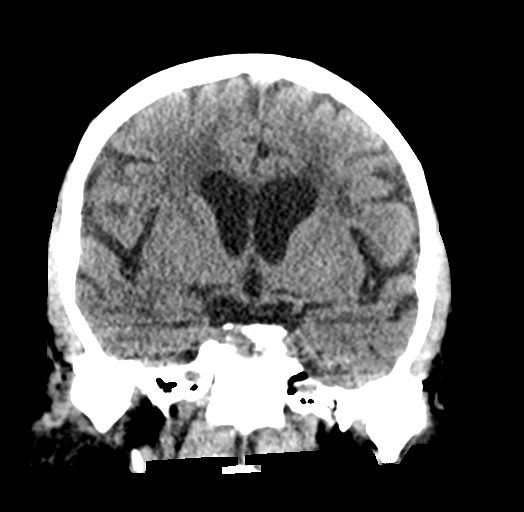
[im 37/66  brain]
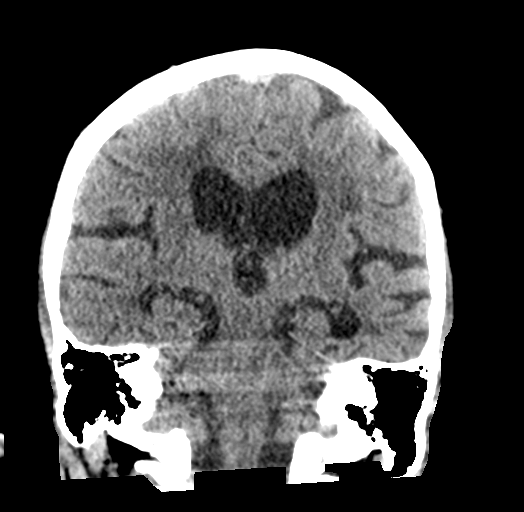

[Series 5: sagittal soft · sagittal · 0.30mm/px · 3 of 52 slices shown]
[im 18/52  brain]
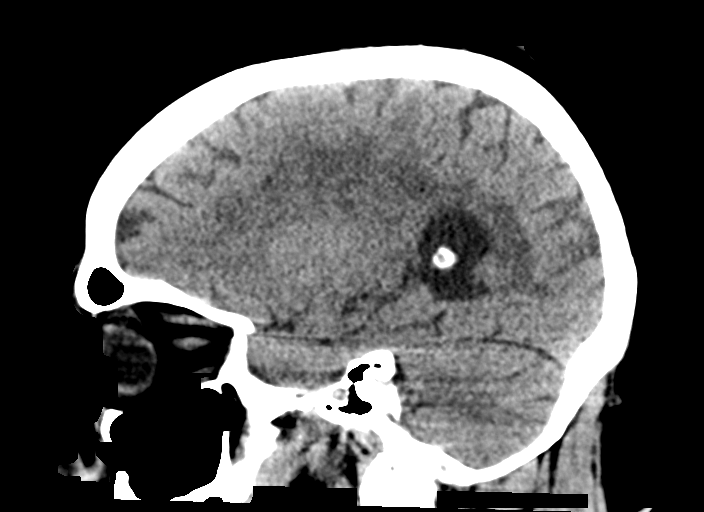
[im 26/52  brain]
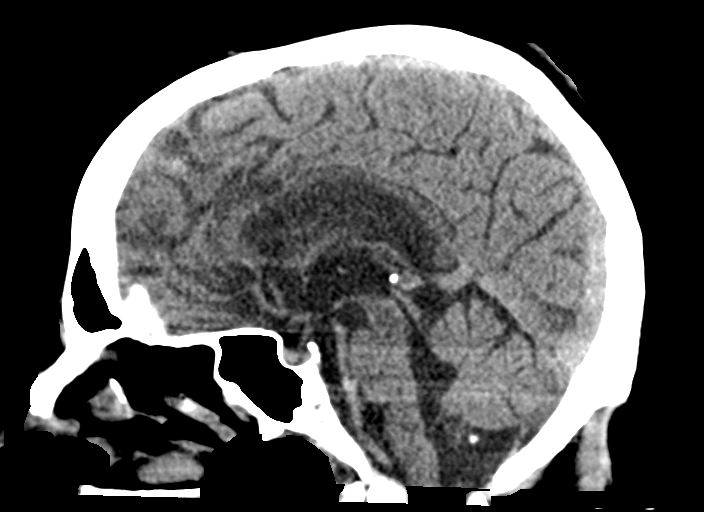
[im 35/52  brain]
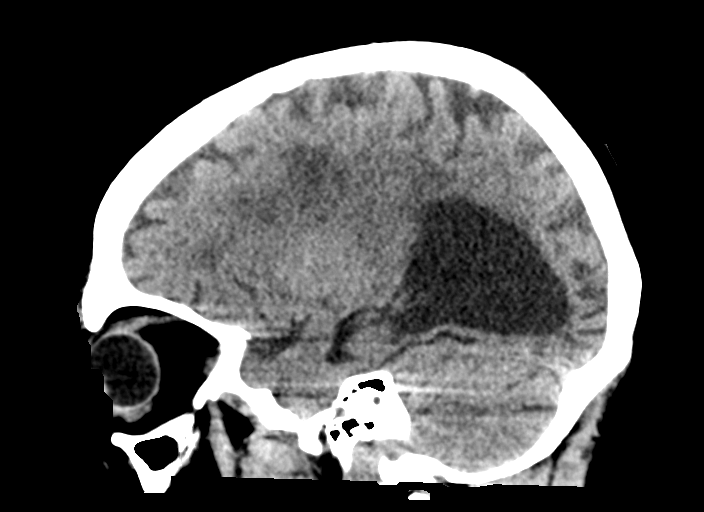

[15 of 47 positions shown; findings below may reference images not displayed]

FINDINGS: Brain: Stable white matter disease consistent with known MS. There
is also age advanced cerebral atrophy and ventriculomegaly with
asymmetric dilatation of the occipital horn of the left lateral
ventricle. No acute intracranial findings such as hemorrhage or
infarction. No extra-axial fluid collections are identified.

Vascular: No vascular calcifications or hyperdense vessels.

Skull: No skull fracture or bone lesions.

Sinuses/Orbits: The paranasal sinuses and mastoid air cells are
clear.

Other: No scalp laceration or hematoma is identified.
IMPRESSION: 1. Stable white matter disease consistent with known MS.
2. Age advanced cerebral atrophy and ventriculomegaly.
3. No acute intracranial findings or skull fracture.

## 2021-07-06 MED ORDER — TRAMADOL HCL 50 MG PO TABS
100.0000 mg | ORAL_TABLET | Freq: Once | ORAL | Status: DC
Start: 2021-07-06 — End: 2021-07-07
  Filled 2021-07-06: qty 2

## 2021-07-06 MED ORDER — ONDANSETRON HCL 4 MG PO TABS: 4.0000 mg | ORAL_TABLET | Freq: Four times a day (QID) | ORAL | Status: DC | PRN

## 2021-07-06 MED ORDER — ENOXAPARIN SODIUM 40 MG/0.4ML IJ SOSY
40.0000 mg | PREFILLED_SYRINGE | INTRAMUSCULAR | Status: DC
  Administered 2021-07-06: 40 mg via SUBCUTANEOUS
  Filled 2021-07-06 (×2): qty 0.4

## 2021-07-06 MED ORDER — ACETAMINOPHEN 650 MG RE SUPP: 650.0000 mg | Freq: Four times a day (QID) | RECTAL | Status: DC | PRN

## 2021-07-06 MED ORDER — ONDANSETRON HCL 4 MG/2ML IJ SOLN: 4.0000 mg | Freq: Four times a day (QID) | INTRAMUSCULAR | Status: DC | PRN

## 2021-07-06 MED ORDER — ACETAMINOPHEN 325 MG PO TABS
650.0000 mg | ORAL_TABLET | Freq: Four times a day (QID) | ORAL | Status: DC | PRN
  Administered 2021-07-07: 650 mg via ORAL
  Filled 2021-07-06: qty 2

## 2021-07-06 MED ORDER — SODIUM CHLORIDE 0.9 % IV SOLN: INTRAVENOUS | Status: DC

## 2021-07-06 MED ORDER — NALOXONE HCL 0.4 MG/ML IJ SOLN
0.4000 mg | Freq: Once | INTRAMUSCULAR | Status: AC
  Administered 2021-07-06: 0.4 mg via INTRAVENOUS
  Filled 2021-07-06: qty 1

## 2021-07-06 MED ORDER — SODIUM CHLORIDE 0.9 % IV BOLUS
1000.0000 mL | Freq: Once | INTRAVENOUS | Status: AC
  Administered 2021-07-06: 1000 mL via INTRAVENOUS

## 2021-07-06 MED ORDER — FINGOLIMOD HCL 0.5 MG PO CAPS: 1.0000 | ORAL_CAPSULE | Freq: Every day | ORAL | Status: DC

## 2021-07-06 NOTE — Progress Notes (Signed)
Patient's mother called sobbing and is really concerned about her daughter.  She stated she has many psycho-social needs and needs many resources.  Mother also suggested that patient isn't capable of making sound decisions for herself.  The provider and case manager have been made aware.

## 2021-07-06 NOTE — Progress Notes (Signed)
Unable to get to [facility] today due to low staffing at [facility]. Will attempt tomorrow if EEG still needed. Dr. Madera made aware. 

## 2021-07-06 NOTE — ED Notes (Signed)
Pt noted with minimal response to narcan admin. She continues to doze off during conversation and is arousable with sternal rub. EDP notified

## 2021-07-06 NOTE — Progress Notes (Signed)
Patient arrived on the unit responsive only to pain.  Provider notified.  Patient was sleepy and snoring. She was unable to follow commands.  At the time of her admission on to the unit, patient was unable to answer admission questions and neuro checks were not able to be performed because of her unresponsiveness.  Will attempt should her neuro status change.    Her home medications were taken to pharmacy.

## 2021-07-06 NOTE — ED Provider Notes (Signed)
Patient care signed out this morning to follow-up CT head results and reassess.  Patient presented combative, altered mental status with clinical concern of possible drug overdose/medicine/drug-related.  Patient did not improve so CT scan of the head added and asked to follow-up.  CT scan head results reviewed no acute abnormalities.  With patient being altered unable to ensure no neck injury with her fall, CT scan of the neck added. Salicylate and Tylenol levels added.  Patient on exam somnolent, opens eyes to loud speech and painful stimuli, briefly moves all extremities, difficult exam to get details from patient. Discussed with hospitalist Dr Arvella Merles due to clinical concern for acute encephalopathy and no improvement in the emergency room.  She is not safe to go home at this time.  Narcan ordered to see if any response.  COVID test pending.  No fever in the ER, no meningismus on exam.  Other differentials include post seizure, MS related, other.  Alcohol levels normal.  Acute encephalopathy    Blane Ohara, MD 07/06/21 0830

## 2021-07-06 NOTE — Progress Notes (Signed)
Patient has alternating bouts of alertness and then falls completely asleep with snoring.

## 2021-07-06 NOTE — ED Notes (Signed)
Pt noted to be more alert at this time. Sitting up and talking on phone

## 2021-07-06 NOTE — ED Notes (Signed)
Pt rude to staff. Unsteady on feet. Refuses to use bedside commode. Poor gait to bathroom . Requested a urine sample

## 2021-07-06 NOTE — H&P (Signed)
History and Physical    Sonia Ayala ZOX:[id number] DOB: 1976-06-19 DOA: 07/06/2021  PCP: Pcp, No   Patient coming from: Home  I have personally briefly reviewed patient's old medical records in [facility]  Chief Complaint: Altered mental status and fall  HPI: Sonia Ayala is a 45 y.o. female with medical history significant of multiple sclerosis, recreational drugs and seizures; who presented to the emergency department after fall and change in mentation.  Unclear if patient experienced mechanical fall or if she had a seizure event.  No chest pain, no nausea, no vomiting, no fever, no focal deficits. Work-up demonstrating negative COVID PCR, stable vital signs and also unremarkable CBC and basic metabolic panel.  CT of the head negative for acute intracranial normalities demonstrating multiple sclerosis changes and ventriculomegaly.  Alcohol level, salicylate and Tylenol level demonstrating no overdose.  UDS positive for marijuana.  Despite fluid resuscitation and Narcan in the ED patient mentation and level of alertness remain impaired; TRH has been contacted to place in the hospital for further evaluation and management.   Review of Systems: Unable to be further inquired secondary to patient altered mentation.  Past Medical History:  Diagnosis Date   Multiple sclerosis (HCC)    Seizures (HCC)     History reviewed. No pertinent surgical history.  Social History  reports that she has been smoking. She has never used smokeless tobacco. She reports previous alcohol use. She reports previous drug use.  Allergies  Allergen Reactions   Gabapentin     rash   Naproxen Hives    Family history: Patient mentation making it impossible to further discuss at this time. -Per previous records there is a history of hypertension in her mother side.  Prior to Admission medications   Medication Sig Start Date End Date Taking? Authorizing Provider  lidocaine (LIDODERM) 5 %  Place onto the skin. 02/19/21  Yes [provider]  GILENYA 0.5 MG CAPS Take 1 capsule by mouth daily. 04/01/21   [provider]  HYDROcodone-acetaminophen (NORCO) 7.5-325 MG tablet Take 1 tablet by mouth 2 (two) times daily as needed for moderate pain. 04/16/21   [provider]  pregabalin (LYRICA) 100 MG capsule Take 100 mg by mouth every 8 (eight) hours as needed (pain). 03/29/21   [provider]  traMADol (ULTRAM) 50 MG tablet Take 50 mg by mouth every 8 (eight) hours as needed for moderate pain. 12/15/20   [provider]    Physical Exam: Vitals:   07/06/21 0930 07/06/21 0945 07/06/21 1109 07/06/21 1121  BP: 119/76 116/75 121/88 107/82  Pulse: 68 69  77  Resp: 19 20  (!) 22  Temp:    98.3 F (36.8 C)  TempSrc:    Oral  SpO2: 99% 99%      Constitutional: Profoundly sleep; responding to painful stimuli.  She may open her eyes and nod/answer simple questions.  But not able to maintain level of alertness. Vitals:   07/06/21 0930 07/06/21 0945 07/06/21 1109 07/06/21 1121  BP: 119/76 116/75 121/88 107/82  Pulse: 68 69  77  Resp: 19 20  (!) 22  Temp:    98.3 F (36.8 C)  TempSrc:    Oral  SpO2: 99% 99%     Eyes: PERRL, lids and conjunctivae normal; no icterus, no nystagmus. ENMT: Mucous membranes are moist. Posterior pharynx clear of any exudate or lesions. Neck: normal, supple, no masses, no thyromegaly; no JVD. Respiratory: clear to auscultation bilaterally, no wheezing,  no crackles. Normal respiratory effort. No accessory muscle use.  Cardiovascular: Regular rate and rhythm, no murmurs / rubs / gallops. No extremity edema. 2+ pedal pulses. No carotid bruits.  Abdomen: no tenderness, no masses palpated. No hepatosplenomegaly. Bowel sounds positive.  Musculoskeletal: no clubbing / cyanosis.  No contracture or spastic abnormalities. Skin: no petechiae. Neurologic: CN 2-12 appears to be grossly intact.  Moving 4 limbs spontaneously;  retracting from painful stimuli.  No muscular deficits appreciated. Psychiatric: Unable to properly assess with current mentation.  Per EDP on arrival patient was combative and with limited rude interaction with staff.   Labs on Admission: I have personally reviewed following labs and imaging studies  CBC: Recent Labs  Lab 07/06/21 0218  WBC 5.9  NEUTROABS 4.6  HGB 12.6  HCT 38.7  MCV 95.8  PLT 296    Basic Metabolic Panel: Recent Labs  Lab 07/06/21 0218  NA 137  K 3.6  CL 106  CO2 26  GLUCOSE 109*  BUN 14  CREATININE 0.74  CALCIUM 8.5*    GFR: CrCl cannot be calculated (Unknown ideal weight.).  Liver Function Tests: Recent Labs  Lab 07/06/21 0218  AST 19  ALT 26  ALKPHOS 52  BILITOT 0.9  PROT 4.1*  ALBUMIN 3.7    Urine analysis:    Component Value Date/Time   COLORURINE YELLOW 07/06/2021 0718   APPEARANCEUR CLEAR 07/06/2021 0718   LABSPEC 1.019 07/06/2021 0718   PHURINE 6.0 07/06/2021 0718   GLUCOSEU NEGATIVE 07/06/2021 0718   HGBUR NEGATIVE 07/06/2021 0718   BILIRUBINUR NEGATIVE 07/06/2021 0718   KETONESUR NEGATIVE 07/06/2021 0718   PROTEINUR NEGATIVE 07/06/2021 0718   NITRITE NEGATIVE 07/06/2021 0718   LEUKOCYTESUR NEGATIVE 07/06/2021 0718    Radiological Exams on Admission: CT Head Wo Contrast  Result Date: 07/06/2021 CLINICAL DATA:  Mental status change. Fell. History of MS. EXAM: CT HEAD WITHOUT CONTRAST TECHNIQUE: Contiguous axial images were obtained from the base of the skull through the vertex without intravenous contrast. COMPARISON:  Multiple prior head CTs. The most recent is 07/03/2021 FINDINGS: Brain: Stable white matter disease consistent with known MS. There is also age advanced cerebral atrophy and ventriculomegaly with asymmetric dilatation of the occipital horn of the left lateral ventricle. No acute intracranial findings such as hemorrhage or infarction. No extra-axial fluid collections are identified. Vascular: No vascular  calcifications or hyperdense vessels. Skull: No skull fracture or bone lesions. Sinuses/Orbits: The paranasal sinuses and mastoid air cells are clear. Other: No scalp laceration or hematoma is identified. IMPRESSION: 1. Stable white matter disease consistent with known MS. 2. Age advanced cerebral atrophy and ventriculomegaly. 3. No acute intracranial findings or skull fracture. Electronically Signed   By: Rudie Meyer M.D.   On: 07/06/2021 07:28    EKG: Independently reviewed.  Sinus rhythm; no acute ischemic changes.  Assessment/Plan 1-Decreased alertness -Possible multifactorial: Associated with medications; recreational drugs no pickup and UDS, seizure event or even narcolepsy. -Patient will be placed in the hospital, telemetry bed, will check EEG, TSH, B12, maintain adequate hydration and constant reorientation. -Will minimize the use of medications that can alter mentation and avoid sedatives/narcotics. -Physical therapy evaluation requested.  2-multiple sclerosis -Resume fingolimod   3-history of seizure -Will check EEG as mentioned above -No antiepileptic drugs use prior to admission.  4-recreational drug use//marijuana abuse -Will recommend cessation counseling when patient is fully awake and oriented.   DVT prophylaxis: Lovenox Code Status:   Full code Family Communication:  No family at bedside. Disposition  Plan:   Patient is from:  Home  Anticipated DC to:  Home  Anticipated DC date:  07/07/21  Anticipated DC barriers: Improvement in patient's mentation and level of alertness.  Consults called:  None Admission status:  Observation, length of stay 8 less than 2 midnights; telemetry bed.    Severity of Illness: The appropriate patient status for this patient is OBSERVATION. Observation status is judged to be reasonable and necessary in order to provide the required intensity of service to ensure the patient's safety. The patient's presenting symptoms, physical exam  findings, and initial radiographic and laboratory data in the context of their medical condition is felt to place them at decreased risk for further clinical deterioration. Furthermore, it is anticipated that the patient will be medically stable for discharge from the hospital within 2 midnights of admission. The following factors support the patient status of observation.   " The patient's presenting symptoms include altered mentation and decreased level of alertness.. " The physical exam findings include decreased level of alertness and inability to keep herself awake.  No focal neurologic deficits seen.. " The initial radiographic and laboratory data are overall blood work and x-rays without acute abnormalities.Vassie Loll MD [facility]  How to contact the [facility] Attending or Consulting provider 7A - 7P or covering provider during after hours 7P -7A, for this patient?   Check the care team in [facility] and look for a) attending/consulting [facility] provider listed and b) the [facility] team listed Log into [email or web address] and use [place]'s universal password to access. If you do not have the password, please contact the hospital operator. Locate the [facility] provider you are looking for under [facility] and page to a number that you can be directly reached. If you still have difficulty reaching the provider, please page the [facility] (Director on Call) for the Hospitalists listed on amion for assistance.  07/06/2021, 11:26 AM

## 2021-07-06 NOTE — ED Provider Notes (Signed)
[facility] EMERGENCY DEPARTMENT Provider Note   CSN: [id number] Arrival date & time: 07/06/21  0129     History Chief Complaint  Patient presents with   Fall   Altered Mental Status    Sonia Ayala is a 45 y.o. female.  The history is provided by the patient and the EMS personnel. The history is limited by the condition of the patient (Altered mental status).  Fall  Altered Mental Status She has history of multiple sclerosis, seizure disorder and was brought in by ambulance after reported fall.  She is complaining of pain in her neck and arms, but speech is slurred and she is difficult to arouse.    Past Medical History:  Diagnosis Date   Multiple sclerosis (HCC)    Seizures [facility])     Patient Active Problem List   Diagnosis Date Noted   Multiple sclerosis (HCC) 05/10/2021    History reviewed. No pertinent surgical history.   OB History   No obstetric history on file.     History reviewed. No pertinent family history.  Social History   Tobacco Use   Smoking status: Every Day   Smokeless tobacco: Never  Substance Use Topics   Alcohol use: Not Currently   Drug use: Not Currently    Home Medications Prior to Admission medications   Medication Sig Start Date End Date Taking? Authorizing Provider  lidocaine (LIDODERM) 5 % Place onto the skin. 02/19/21  Yes [provider]  GILENYA 0.5 MG CAPS Take 1 capsule by mouth daily. 04/01/21   [provider]  HYDROcodone-acetaminophen (NORCO) 7.5-325 MG tablet Take 1 tablet by mouth 2 (two) times daily as needed for moderate pain. 04/16/21   [provider]  pregabalin (LYRICA) 100 MG capsule Take 100 mg by mouth every 8 (eight) hours as needed (pain). 03/29/21   [provider]  traMADol (ULTRAM) 50 MG tablet Take 50 mg by mouth every 8 (eight) hours as needed for moderate pain. 12/15/20   [provider]    Allergies    Gabapentin and Naproxen  Review of Systems    Review of Systems  Unable to perform ROS: Mental status change   Physical Exam Updated Vital Signs BP 110/74   Pulse 69   Resp (!) 22   LMP  (LMP Unknown)   SpO2 97%   Physical Exam Vitals and nursing note reviewed.  45 year old female, resting comfortably and in no acute distress. Vital signs are normal. Oxygen saturation is 97%, which is normal. Head is normocephalic and atraumatic. PERRLA, EOMI. Oropharynx is clear. Neck is nontender without adenopathy or JVD. Back is nontender and there is no CVA tenderness. Lungs are clear without rales, wheezes, or rhonchi. Chest is nontender. Heart has regular rate and rhythm without murmur. Abdomen is soft, flat, nontender without masses or hepatosplenomegaly and peristalsis is normoactive. Extremities have no cyanosis or edema, full range of motion is present of the joints without pain. Skin is warm and dry without rash. Neurologic: Somnolent but arousable.  Speech is slurred when aroused, but she is oriented to person and place and time.  Cranial nerves are grossly intact.  She moves all extremities equally.  ED Results / Procedures / Treatments   Labs (all labs ordered are listed, but only abnormal results are displayed) Labs Reviewed  COMPREHENSIVE METABOLIC PANEL - Abnormal; Notable for the following components:      Result Value   Glucose, Bld 109 (*)    Calcium 8.5 (*)  Total Protein 4.1 (*)    All other components within normal limits  CBC WITH DIFFERENTIAL/PLATELET - Abnormal; Notable for the following components:   Lymphs Abs 0.5 (*)    All other components within normal limits  ETHANOL  HCG, SERUM, QUALITATIVE  RAPID URINE DRUG SCREEN, HOSP PERFORMED  URINALYSIS, ROUTINE W REFLEX MICROSCOPIC    EKG EKG Interpretation  Date/Time:  Tuesday July 06 2021 01:54:42 EDT Ventricular Rate:  78 PR Interval:  152 QRS Duration: 78 QT Interval:  399 QTC Calculation: 455 R Axis:   30 Text Interpretation: Sinus rhythm  Probable left atrial enlargement Low voltage, precordial leads When compared with ECG of 05/10/2021, No significant change was found Confirmed by Dione Booze ([id number]) on 07/06/2021 2:04:06 AM  Radiology No results found.  Procedures Procedures   Medications Ordered in ED Medications - No data to display  ED Course  I have reviewed the triage vital signs and the nursing notes.  Pertinent labs & imaging results that were available during my care of the patient were reviewed by me and considered in my medical decision making (see chart for details).   MDM Rules/Calculators/A&P                         Report of fall without any sign of injury on exam.  Somnolence, possibly drug-induced.  Will check ethanol level and urine drug screen.  She is maintaining good oxygen saturation, so will not give naloxone.  Old records are reviewed, and she has no relevant past visits.  ECG is unremarkable.  CBC and metabolic panel are normal and alcohol level is undetectable.  Patient was observed in the emergency department.  She maintained good oxygen saturation throughout, but she continues to be very somnolent.  When aroused, she will answer a few questions but mostly is argumentative and combative.  She has been observed long enough that narcotic overdose should have resolved.  Urinalysis and urine drug screen are still pending.  She is being sent for CT of the head to look for other causes for altered mental status.  Case is signed out to Dr. Jodi Mourning.  Final Clinical Impression(s) / ED Diagnoses Final diagnoses:  Altered mental status, unspecified altered mental status type    Rx / DC Orders ED Discharge Orders     None        Dione Booze, MD 07/06/21 [phone]

## 2021-07-06 NOTE — ED Triage Notes (Signed)
[facility] brings pt in to ED for FALL, altered mental status on arrival, will awaken with aggressive stimulation. Pupils 2 and reactive. Oriented x 4 with aggressive stimulation otherwise sedate.

## 2021-07-06 NOTE — Progress Notes (Signed)
   07/06/21 1306  Assess: MEWS Score  Temp 97.8 F (36.6 C)  BP (!) 156/83  Pulse Rate 68  Resp 20  Level of Consciousness Responds to Pain  SpO2 99 %  O2 Device Room Air  Assess: MEWS Score  MEWS Temp 0  MEWS Systolic 0  MEWS Pulse 0  MEWS RR 0  MEWS LOC 2  MEWS Score 2  MEWS Score Color Yellow  Treat  Pain Scale 0-10  Pain Score 0

## 2021-07-07 ENCOUNTER — Observation Stay (HOSPITAL_COMMUNITY)
Admit: 2021-07-07 | Discharge: 2021-07-07 | Disposition: A | Discharge status: Home / Self Care | Payer: 59 | Attending: Internal Medicine | Admitting: Internal Medicine

## 2021-07-07 DIAGNOSIS — R4182 Altered mental status, unspecified: Secondary | ICD-10-CM | POA: Diagnosis not present

## 2021-07-07 DIAGNOSIS — G9340 Encephalopathy, unspecified: Secondary | ICD-10-CM | POA: Diagnosis not present

## 2021-07-07 DIAGNOSIS — R419 Unspecified symptoms and signs involving cognitive functions and awareness: Secondary | ICD-10-CM | POA: Diagnosis not present

## 2021-07-07 LAB — COMPREHENSIVE METABOLIC PANEL
ALT: 19 U/L (ref 0–44)
AST: 13 U/L — ABNORMAL LOW (ref 15–41)
Albumin: 3.4 g/dL — ABNORMAL LOW (ref 3.5–5.0)
Alkaline Phosphatase: 52 U/L (ref 38–126)
Anion gap: 10 (ref 5–15)
BUN: 8 mg/dL (ref 6–20)
CO2: 17 mmol/L — ABNORMAL LOW (ref 22–32)
Calcium: 8.4 mg/dL — ABNORMAL LOW (ref 8.9–10.3)
Chloride: 110 mmol/L (ref 98–111)
Creatinine, Ser: 0.51 mg/dL (ref 0.44–1.00)
GFR, Estimated: >60 mL/min (ref >60)
Glucose, Bld: 116 mg/dL — ABNORMAL HIGH (ref 70–99)
Potassium: 3.7 mmol/L (ref 3.5–5.1)
Sodium: 137 mmol/L (ref 135–145)
Total Bilirubin: 0.5 mg/dL (ref 0.3–1.2)
Total Protein: 5.9 g/dL — ABNORMAL LOW (ref 6.5–8.1)

## 2021-07-07 MED ORDER — CYANOCOBALAMIN 1000 MCG/ML IJ SOLN
1000.0000 ug | Freq: Once | INTRAMUSCULAR | Status: AC
  Administered 2021-07-07: 1000 ug via INTRAMUSCULAR
  Filled 2021-07-07: qty 1

## 2021-07-07 MED ORDER — VITAMIN B-12 100 MCG PO TABS: 500.0000 ug | ORAL_TABLET | Freq: Every day | ORAL | Status: DC

## 2021-07-07 MED ORDER — CYANOCOBALAMIN 500 MCG PO TABS: 500.0000 ug | ORAL_TABLET | Freq: Every day | ORAL | Status: AC

## 2021-07-07 NOTE — Progress Notes (Signed)
EEG complete - results pending 

## 2021-07-07 NOTE — Progress Notes (Signed)
Security called an stated that they seen patient across the street at the bank.

## 2021-07-07 NOTE — Progress Notes (Signed)
This writer went to check on patient, patient was not in room. Patient seen in room 25-30 minutes ago. Made Charge Nurse, security, [facility] and MD Tat Aware. Patient had been talking about leaving several times today but informed this Clinical research associate that she was staying to get well. Attempted to contact patient in regards to medication patient had at the pharmacy. Unable to reach patient.

## 2021-07-07 NOTE — Evaluation (Signed)
Physical Therapy Evaluation Patient Details Name: Sonia Ayala MRN: [id number] DOB: October 25, 1976 Today's Date: 07/07/2021   History of Present Illness  Sonia Ayala is a 45 y.o. female with medical history significant of multiple sclerosis, recreational drugs and seizures; who presented to the emergency department after fall and change in mentation.  Unclear if patient experienced mechanical fall or if she had a seizure event.  No chest pain, no nausea, no vomiting, no fever, no focal deficits.  Work-up demonstrating negative COVID PCR, stable vital signs and also unremarkable CBC and basic metabolic panel.  CT of the head negative for acute intracranial normalities demonstrating multiple sclerosis changes and ventriculomegaly.  Alcohol level, salicylate and Tylenol level demonstrating no overdose.  UDS positive for marijuana.  Despite fluid resuscitation and Narcan in the ED patient mentation and level of alertness remain impaired; TRH has been contacted to place in the hospital for further evaluation and management.   Clinical Impression  Patient functioning near baseline for functional mobility and gait demonstrating good return for ambulation in room, hallways, stairs and transferring to/from commode in bathroom without loss of balance.  Patient encouraged to ambulate in room/hallway ad lib while in hospital.  Plan:  Patient discharged from physical therapy to care of nursing for ambulation daily as tolerated for length of stay.      Follow Up Recommendations Supervision - Intermittent    Equipment Recommendations  None recommended by PT    Recommendations for Other Services       Precautions / Restrictions Precautions Precautions: None Restrictions Weight Bearing Restrictions: No      Mobility  Bed Mobility Overal bed mobility: Modified Independent                  Transfers Overall transfer level: Modified independent                   Ambulation/Gait Ambulation/Gait assistance: Modified independent (Device/Increase time) Gait Distance (Feet): 150 Feet Assistive device: None;IV Pole Gait Pattern/deviations: Decreased step length - right;Decreased step length - left;Decreased stride length Gait velocity: decreased   General Gait Details: good return for ambulation in room and hallways with slightly labored cadence without loss of balance  Stairs Stairs: Yes Stairs assistance: Modified independent (Device/Increase time) Stair Management: One rail Right;One rail Left;Step to pattern Number of Stairs: 4 General stair comments: patient demonstrates good return for going up/down steps using 1 siderail without loss of balance  Wheelchair Mobility    Modified Rankin (Stroke Patients Only)       Balance Overall balance assessment: Mild deficits observed, not formally tested                                           Pertinent Vitals/Pain Pain Assessment: No/denies pain    Home Living Family/patient expects to be discharged to:: Shelter/Homeless                      Prior Function Level of Independence: Independent         Comments: Community ambulator without AD, does not drive     Hand Dominance        Extremity/Trunk Assessment   Upper Extremity Assessment Upper Extremity Assessment: Overall WFL for tasks assessed    Lower Extremity Assessment Lower Extremity Assessment: Overall WFL for tasks assessed    Cervical / Trunk Assessment Cervical /  Trunk Assessment: Kyphotic  Communication   Communication: No difficulties  Cognition Arousal/Alertness: Awake/alert Behavior During Therapy: WFL for tasks assessed/performed Overall Cognitive Status: Within Functional Limits for tasks assessed                                        General Comments      Exercises     Assessment/Plan    PT Assessment Patent does not need any further PT services   PT Problem List         PT Treatment Interventions      PT Goals (Current goals can be found in the Care Plan section)  Acute Rehab PT Goals Patient Stated Goal: return home PT Goal Formulation: With patient Time For Goal Achievement: 07/07/21 Potential to Achieve Goals: Good    Frequency     Barriers to discharge        Co-evaluation               AM-PAC PT "6 Clicks" Mobility  Outcome Measure Help needed turning from your back to your side while in a flat bed without using bedrails?: None Help needed moving from lying on your back to sitting on the side of a flat bed without using bedrails?: None Help needed moving to and from a bed to a chair (including a wheelchair)?: None Help needed standing up from a chair using your arms (e.g., wheelchair or bedside chair)?: None Help needed to walk in hospital room?: None Help needed climbing 3-5 steps with a railing? : None 6 Click Score: 24    End of Session   Activity Tolerance: Patient tolerated treatment well Patient left: with call bell/phone within reach;Other (comment) (left in bathroom) Nurse Communication: Mobility status PT Visit Diagnosis: Unsteadiness on feet (R26.81);Other abnormalities of gait and mobility (R26.89);Muscle weakness (generalized) (M62.81)    Time: 3875-6433 PT Time Calculation (min) (ACUTE ONLY): 23 min   Charges:   PT Evaluation $PT Eval Moderate Complexity: 1 Mod PT Treatments $Therapeutic Activity: 23-37 mins        10:07 AM, 07/07/21 Ocie Bob, MPT Physical Therapist with [facility] [phone] office [phone] mobile phone

## 2021-07-07 NOTE — Care Management Obs Status (Signed)
MEDICARE OBSERVATION STATUS NOTIFICATION   Patient Details  Name: Sonia Ayala MRN: [id number] Date of Birth: 1976-04-15   Medicare Observation Status Notification Given:  Yes    Corey Harold 07/07/2021, 3:38 PM

## 2021-07-07 NOTE — TOC Initial Note (Signed)
Transition of Care [facility]) - Initial/Assessment Note    Patient Details  Name: Sonia Ayala MRN: [id number] Date of Birth: 03/31/1976  Transition of Care [facility]) CM/SW Contact:    Leitha Bleak, RN Phone Number: 07/07/2021, 12:27 PM  Clinical Narrative:  [facility] consulted patient is wanting to leave AMA, patient is homeless. States she and her boyfriend came to [place] a month ago from [place]. She came here to be near her mother and daughter. Her mother lives in an apartment they does not allow overnight guest. Patient is unsure if they will stay in the area. TOC provided Area list of shelters and encouraged patient to stay as directed by MD to get medical attention needed.                  Expected Discharge Plan: Home/Self Care Barriers to Discharge: Continued Medical Work up   Patient Goals and CMS Choice    Expected Discharge Plan and Services Expected Discharge Plan: Home/Self Care      Prior Living Arrangements/Services   Lives with:: Other (Comment) (Homeless)   Activities of Daily Living    Permission Sought/Granted     Emotional Assessment  Alcohol / Substance Use: Not Applicable Psych Involvement: No (comment)  Admission diagnosis:  Decreased alertness [R41.9] Altered mental status, unspecified altered mental status type [R41.82] Patient Active Problem List   Diagnosis Date Noted   Decreased alertness 07/06/2021   Multiple sclerosis (HCC) 05/10/2021   PCP:  Pcp, No Pharmacy:  No Pharmacies Listed  Readmission Risk Interventions No flowsheet data found.

## 2021-07-07 NOTE — Discharge Summary (Signed)
Physician Discharge Summary  Sonia Ayala MHD:[id number] DOB: 1976-05-27 DOA: 07/06/2021  PCP: Pcp, No  Admit date: 07/06/2021 Discharge date: 07/07/2021  Admitted From: Home Disposition:  AGAINST MEDICAL ADVICE  Recommendations for Outpatient Follow-up:  Follow up with PCP in 1-2 weeks Please obtain BMP/CBC in one week   Discharge Condition: AGAINST MEDICAL ADVICE CODE STATUS: FULL   Brief/Interim Summary: 45 year old female with a history Seizure, multiple sclerosis, and substance abuse presenting with altered mental status and reported fall.  The patient herself does not recall the events surrounding her altered mental status.  She states that she recently relocated to [place] to be close to her daughter and mother.  She previously lived in .  She is a poor historian.  There are many inconsistencies regarding her history even when speaking with the patient's significant other.  Apparently, she has not followed up with a physician in 3 to 4 months, and she does not have a local physician.  She denies any fevers, chills, headache, chest pain, shortness of breath, coughing, hemoptysis, nausea, vomiting, diarrhea.  Apparently, the patient was somnolent in the emergency department.  When aroused, the patient became somewhat combative.  The patient was admitted for further evaluation of her altered mental status.  Narcan was given without much effect.  Work-up for altered mental status was undertaken.  Throughout the hospitalization, the patient's vital signs remained stable.  She was hemodynamically stable saturating 99% room air.  BMP, LFTs, and CBC were unremarkable.  CT of the brain was negative for any acute findings.  CT of the cervical spine was negative for any fracture or dislocation.  Work-up for altered mental status revealed a low B12 level.  TSH was 1.071.  Ammonia was 29.  EEG was ordered. In afternoon 07/07/21, patient left hospital without notifying RN or any of staff.   When nursing went to check on patient, no one was in the room.  Security was notified  Discharge Diagnoses:  Acute Encephalopathy, type unspecified -Suspect substance-induced/medication induced -PDMP reviewed--no controlled substance Rx in , [place], [place] -07/07/21--mental status much improved -UDS positive THC -CT brain neg for acute findings -UA--neg for pyuria -TSH 1.071 -ammonia 29 -B12--148-->supplement -EEG--results pending -VBG--no hypercarbia -avoid tramadol  Multiple sclerosis -continue fingolimod -referral made to local neurology, Dr. Gerilyn Pilgrim  Seizure Disorder -discrepancies regarding if she is taking or even on an AED  Discharge Instructions   Allergies as of 07/07/2021       Reactions   Gabapentin    rash   Naproxen Hives        Medication List     STOP taking these medications    traMADol 50 MG tablet Commonly known as: ULTRAM       TAKE these medications    Gilenya 0.5 MG Caps Generic drug: Fingolimod HCl Take 1 capsule by mouth daily.   HYDROcodone-acetaminophen 7.5-325 MG tablet Commonly known as: NORCO Take 1 tablet by mouth 2 (two) times daily as needed for moderate pain.   lidocaine 5 % Commonly known as: LIDODERM Place onto the skin.   pregabalin 100 MG capsule Commonly known as: LYRICA Take 100 mg by mouth every 8 (eight) hours as needed (pain).   vitamin B-12 500 MCG tablet Commonly known as: CYANOCOBALAMIN Take 1 tablet (500 mcg total) by mouth daily. Start taking on: July 08, 2021        Follow-up Information     Beryle Beams, MD. Schedule an appointment as soon as possible for a visit in  3 week(s).   Specialty: Neurology Why: The office will call with appointment. Contact information: [address] [place] [address] [phone]                Allergies  Allergen Reactions   Gabapentin     rash   Naproxen Hives    Consultations: none   Procedures/Studies: CT Head Wo Contrast  Result Date:  07/06/2021 CLINICAL DATA:  Mental status change. Fell. History of MS. EXAM: CT HEAD WITHOUT CONTRAST TECHNIQUE: Contiguous axial images were obtained from the base of the skull through the vertex without intravenous contrast. COMPARISON:  Multiple prior head CTs. The most recent is 07/03/2021 FINDINGS: Brain: Stable white matter disease consistent with known MS. There is also age advanced cerebral atrophy and ventriculomegaly with asymmetric dilatation of the occipital horn of the left lateral ventricle. No acute intracranial findings such as hemorrhage or infarction. No extra-axial fluid collections are identified. Vascular: No vascular calcifications or hyperdense vessels. Skull: No skull fracture or bone lesions. Sinuses/Orbits: The paranasal sinuses and mastoid air cells are clear. Other: No scalp laceration or hematoma is identified. IMPRESSION: 1. Stable white matter disease consistent with known MS. 2. Age advanced cerebral atrophy and ventriculomegaly. 3. No acute intracranial findings or skull fracture. Electronically Signed   By: Rudie Meyer M.D.   On: 07/06/2021 07:28   CT Cervical Spine Wo Contrast  Result Date: 07/06/2021 CLINICAL DATA:  Larey Seat. EXAM: CT CERVICAL SPINE WITHOUT CONTRAST TECHNIQUE: Multidetector CT imaging of the cervical spine was performed without intravenous contrast. Multiplanar CT image reconstructions were also generated. COMPARISON:  Prior study 07/03/2021. FINDINGS: Alignment: Normal overall alignment of the cervical vertebral bodies. Skull base and vertebrae: Remote appearing fractures of C1 are again demonstrated. The ununited fractures have smooth and corticated margins and there is areas of sclerosis suggesting a remote injury. No change since the prior recent examination. This is not obvious on the prior cervical spine MRI from 2022. The skull base C1 and C1-2 articulations are maintained. There are moderate degenerative changes at C1-2. No acute cervical spine fractures  are identified. Soft tissues and spinal canal: No prevertebral fluid or swelling. No visible canal hematoma. Disc levels: The spinal canal is generous. There is multilevel facet disease but no significant spinal or foraminal stenosis. Upper chest: The lung apices are grossly clear. Other: No neck mass or adenopathy. IMPRESSION: 1. Remote appearing fractures of C1. No change since the prior recent examination. 2. No acute cervical spine fractures. 3. 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 Electronically Signed   By: Rudie Meyer M.D.   On: 07/06/2021 09:07        Discharge Exam: Vitals:   07/07/21 0430 07/07/21 1300  BP: 107/82 111/70  Pulse: 70 67  Resp: 18 16  Temp: 98.4 F (36.9 C) 98 F (36.7 C)  SpO2: 99% 97%   Vitals:   07/06/21 2121 07/07/21 0037 07/07/21 0430 07/07/21 1300  BP: 133/77 119/66 107/82 111/70  Pulse: 72 68 70 67  Resp: 18 18 18 16   Temp: 98.6 F (37 C) 98.4 F (36.9 C) 98.4 F (36.9 C) 98 F (36.7 C)  TempSrc:  Oral Oral Oral  SpO2: 97% 98% 99% 97%    General: Pt is alert, awake, not in acute distress Cardiovascular: RRR, S1/S2 +, no rubs, no gallops Respiratory: CTA bilaterally, no wheezing, no rhonchi Abdominal: Soft, NT, ND, bowel sounds + Extremities: no edema, no cyanosis   The results of significant diagnostics from this hospitalization (  including imaging, microbiology, ancillary and laboratory) are listed below for reference.    Significant Diagnostic Studies: CT Head Wo Contrast  Result Date: 07/06/2021 CLINICAL DATA:  Mental status change. Fell. History of MS. EXAM: CT HEAD WITHOUT CONTRAST TECHNIQUE: Contiguous axial images were obtained from the base of the skull through the vertex without intravenous contrast. COMPARISON:  Multiple prior head CTs. The most recent is 07/03/2021 FINDINGS: Brain: Stable white matter disease consistent with known MS. There is also age advanced cerebral atrophy and ventriculomegaly with  asymmetric dilatation of the occipital horn of the left lateral ventricle. No acute intracranial findings such as hemorrhage or infarction. No extra-axial fluid collections are identified. Vascular: No vascular calcifications or hyperdense vessels. Skull: No skull fracture or bone lesions. Sinuses/Orbits: The paranasal sinuses and mastoid air cells are clear. Other: No scalp laceration or hematoma is identified. IMPRESSION: 1. Stable white matter disease consistent with known MS. 2. Age advanced cerebral atrophy and ventriculomegaly. 3. No acute intracranial findings or skull fracture. Electronically Signed   By: Rudie Meyer M.D.   On: 07/06/2021 07:28   CT Cervical Spine Wo Contrast  Result Date: 07/06/2021 CLINICAL DATA:  Larey Seat. EXAM: CT CERVICAL SPINE WITHOUT CONTRAST TECHNIQUE: Multidetector CT imaging of the cervical spine was performed without intravenous contrast. Multiplanar CT image reconstructions were also generated. COMPARISON:  Prior study 07/03/2021. FINDINGS: Alignment: Normal overall alignment of the cervical vertebral bodies. Skull base and vertebrae: Remote appearing fractures of C1 are again demonstrated. The ununited fractures have smooth and corticated margins and there is areas of sclerosis suggesting a remote injury. No change since the prior recent examination. This is not obvious on the prior cervical spine MRI from 2022. The skull base C1 and C1-2 articulations are maintained. There are moderate degenerative changes at C1-2. No acute cervical spine fractures are identified. Soft tissues and spinal canal: No prevertebral fluid or swelling. No visible canal hematoma. Disc levels: The spinal canal is generous. There is multilevel facet disease but no significant spinal or foraminal stenosis. Upper chest: The lung apices are grossly clear. Other: No neck mass or adenopathy. IMPRESSION: 1. Remote appearing fractures of C1. No change since the prior recent examination. 2. No acute cervical  spine fractures. 3. 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 Electronically Signed   By: Rudie Meyer M.D.   On: 07/06/2021 09:07    Microbiology: Recent Results (from the past 240 hour(s))  Resp Panel by RT-PCR (Flu A&B, Covid) Nasopharyngeal Swab     Status: None   Collection Time: 07/06/21  7:50 AM   Specimen: Nasopharyngeal Swab; Nasopharyngeal(NP) swabs in vial transport medium  Result Value Ref Range Status   SARS Coronavirus 2 by RT PCR NEGATIVE NEGATIVE Final    Comment: (NOTE) SARS-CoV-2 target nucleic acids are NOT DETECTED.  The SARS-CoV-2 RNA is generally detectable in upper respiratory specimens during the acute phase of infection. The lowest concentration of SARS-CoV-2 viral copies this assay can detect is 138 copies/mL. A negative result does not preclude SARS-Cov-2 infection and should not be used as the sole basis for treatment or other patient management decisions. A negative result may occur with  improper specimen collection/handling, submission of specimen other than nasopharyngeal swab, presence of viral mutation(s) within the areas targeted by this assay, and inadequate number of viral copies(<138 copies/mL). A negative result must be combined with clinical observations, patient history, and epidemiological information. The expected result is Negative.  Fact Sheet for Patients:  BloggerCourse.com  Fact Sheet for  Healthcare Providers:  SeriousBroker.it  This test is no t yet approved or cleared by the [place] and  has been authorized for detection and/or diagnosis of SARS-CoV-2 by FDA under an Emergency Use Authorization (EUA). This EUA will remain  in effect (meaning this test can be used) for the duration of the COVID-19 declaration under Section 564(b)(1) of the Act, 21 U.S.C.section 360bbb-3(b)(1), unless the authorization is terminated  or revoked sooner.        Influenza A by PCR NEGATIVE NEGATIVE Final   Influenza B by PCR NEGATIVE NEGATIVE Final    Comment: (NOTE) The Xpert Xpress SARS-CoV-2/FLU/RSV plus assay is intended as an aid in the diagnosis of influenza from Nasopharyngeal swab specimens and should not be used as a sole basis for treatment. Nasal washings and aspirates are unacceptable for Xpert Xpress SARS-CoV-2/FLU/RSV testing.  Fact Sheet for Patients: BloggerCourse.com  Fact Sheet for Healthcare Providers: SeriousBroker.it  This test is not yet approved or cleared by the [place] FDA and has been authorized for detection and/or diagnosis of SARS-CoV-2 by FDA under an Emergency Use Authorization (EUA). This EUA will remain in effect (meaning this test can be used) for the duration of the COVID-19 declaration under Section 564(b)(1) of the Act, 21 U.S.C. section 360bbb-3(b)(1), unless the authorization is terminated or revoked.  Performed at [facility], [address], [place], [place] [address]      Labs: Basic Metabolic Panel: Recent Labs  Lab 07/06/21 0218 07/06/21 0807 07/07/21 0437  NA 137  --  137  K 3.6  --  3.7  CL 106  --  110  CO2 26  --  17*  GLUCOSE 109*  --  116*  BUN 14  --  8  CREATININE 0.74  --  0.51  CALCIUM 8.5*  --  8.4*  MG  --  1.9  --   PHOS  --  4.1  --    Liver Function Tests: Recent Labs  Lab 07/06/21 0218 07/07/21 0437  AST 19 13*  ALT 26 19  ALKPHOS 52 52  BILITOT 0.9 0.5  PROT 4.1* 5.9*  ALBUMIN 3.7 3.4*   No results for input(s): LIPASE, AMYLASE in the last 168 hours. Recent Labs  Lab 07/06/21 0805  AMMONIA 29   CBC: Recent Labs  Lab 07/06/21 0218  WBC 5.9  NEUTROABS 4.6  HGB 12.6  HCT 38.7  MCV 95.8  PLT 296   Cardiac Enzymes: No results for input(s): CKTOTAL, CKMB, CKMBINDEX, TROPONINI in the last 168 hours. BNP: Invalid input(s): POCBNP CBG: Recent Labs  Lab 07/06/21 1625  GLUCAP 115*     Time coordinating discharge:  36 minutes  Signed:  Catarina Hartshorn, DO [facility] Pager: [phone] 07/07/2021, 4:54 PM

## 2021-07-07 NOTE — Procedures (Signed)
Patient Name: Sonia Ayala  MRN: [id number]  Epilepsy Attending: Charlsie Quest  Referring Physician/Provider: Dr Vassie Loll Date: 07/07/2021  Duration: 22.05 mins  Patient history: 44yo F with ams. EEG to evaluate for seizure  Level of alertness: Awake  AEDs during EEG study: None  Technical aspects: This EEG study was done with scalp electrodes positioned according to the 10-20 International system of electrode placement. Electrical activity was acquired at a sampling rate of 500Hz  and reviewed with a high frequency filter of 70Hz  and a low frequency filter of 1Hz . EEG data were recorded continuously and digitally stored.   Description: The posterior dominant rhythm consists of 7.5Hz  activity of moderate voltage (25-35 uV) seen predominantly in posterior head regions, symmetric and reactive to eye opening and eye closing. EEG showed intermittent generalized 3 to 6 Hz theta-delta slowing. Spikes were noted in left parieto-occipital region. Hyperventilation and photic stimulation were not performed.     ABNORMALITY - Spike, left parieto-occipital region. - Intermittent slow, generalized  IMPRESSION: This study showed evidence of potential epileptogenicity arising from left parieto-occipital region. There is also mild diffuse encephalopathy, nonspecific etiology. No seizures were seen throughout the recording.  Alf Doyle 
